# Patient Record
Sex: Male | Born: 1950 | Race: White | Hispanic: No | Marital: Married | Smoking: Never smoker
Health system: Southern US, Community
[De-identification: ages and names within clinical notes are randomized; demographics above are authoritative.]

## PROBLEM LIST (undated history)

## (undated) DIAGNOSIS — K429 Umbilical hernia without obstruction or gangrene: Secondary | ICD-10-CM

## (undated) DIAGNOSIS — IMO0002 Reserved for concepts with insufficient information to code with codable children: Secondary | ICD-10-CM

## (undated) DIAGNOSIS — F410 Panic disorder [episodic paroxysmal anxiety] without agoraphobia: Secondary | ICD-10-CM

## (undated) DIAGNOSIS — Z860100 Personal history of colon polyps, unspecified: Secondary | ICD-10-CM

## (undated) DIAGNOSIS — E7800 Pure hypercholesterolemia, unspecified: Secondary | ICD-10-CM

## (undated) DIAGNOSIS — I639 Cerebral infarction, unspecified: Secondary | ICD-10-CM

## (undated) DIAGNOSIS — N400 Enlarged prostate without lower urinary tract symptoms: Secondary | ICD-10-CM

## (undated) HISTORY — DX: Personal history of colon polyps, unspecified: Z86.0100

## (undated) HISTORY — DX: Panic disorder (episodic paroxysmal anxiety): F41.0

## (undated) HISTORY — DX: Reserved for concepts with insufficient information to code with codable children: IMO0002

## (undated) HISTORY — DX: Cerebral infarction, unspecified: I63.9

## (undated) HISTORY — DX: Personal history of colonic polyps: Z86.010

## (undated) HISTORY — DX: Pure hypercholesterolemia, unspecified: E78.00

## (undated) HISTORY — DX: Benign prostatic hyperplasia without lower urinary tract symptoms: N40.0

## (undated) HISTORY — DX: Umbilical hernia without obstruction or gangrene: K42.9

## (undated) HISTORY — PX: KNEE ARTHROSCOPY: SUR90

---

## 2001-06-08 ENCOUNTER — Encounter: Payer: Self-pay | Admitting: Pulmonary Disease

## 2001-06-08 ENCOUNTER — Ambulatory Visit (HOSPITAL_COMMUNITY): Admission: RE | Admit: 2001-06-08 | Discharge: 2001-06-08 | Payer: Self-pay | Admitting: Pulmonary Disease

## 2005-05-16 ENCOUNTER — Ambulatory Visit: Payer: Self-pay | Admitting: Pulmonary Disease

## 2006-06-12 ENCOUNTER — Ambulatory Visit: Payer: Self-pay | Admitting: Pulmonary Disease

## 2006-12-01 ENCOUNTER — Ambulatory Visit: Payer: Self-pay | Admitting: Pulmonary Disease

## 2006-12-01 LAB — CONVERTED CEMR LAB
Albumin: 4 g/dL (ref 3.5–5.2)
BUN: 13 mg/dL (ref 6–23)
Basophils Absolute: 0 10*3/uL (ref 0.0–0.1)
Basophils Relative: 0.5 % (ref 0.0–1.0)
CO2: 29 meq/L (ref 19–32)
Calcium: 9.4 mg/dL (ref 8.4–10.5)
Chloride: 109 meq/L (ref 96–112)
Creatinine, Ser: 1.2 mg/dL (ref 0.4–1.5)
Eosinophils Absolute: 0.2 10*3/uL (ref 0.0–0.6)
Eosinophils Relative: 3.9 % (ref 0.0–5.0)
GFR calc Af Amer: 81 mL/min
GFR calc non Af Amer: 67 mL/min
Glucose, Bld: 93 mg/dL (ref 70–99)
HCT: 44.3 % (ref 39.0–52.0)
Hemoglobin: 15.4 g/dL (ref 13.0–17.0)
Lymphocytes Relative: 25.3 % (ref 12.0–46.0)
MCHC: 34.8 g/dL (ref 30.0–36.0)
MCV: 90.9 fL (ref 78.0–100.0)
Monocytes Absolute: 0.3 10*3/uL (ref 0.2–0.7)
Monocytes Relative: 7.9 % (ref 3.0–11.0)
Neutro Abs: 2.8 10*3/uL (ref 1.4–7.7)
Neutrophils Relative %: 62.4 % (ref 43.0–77.0)
Phosphorus: 3.5 mg/dL (ref 2.3–4.6)
Platelets: 148 10*3/uL — ABNORMAL LOW (ref 150–400)
Potassium: 4 meq/L (ref 3.5–5.1)
RBC: 4.88 M/uL (ref 4.22–5.81)
RDW: 12 % (ref 11.5–14.6)
Sed Rate: 4 mm/hr (ref 0–20)
Sodium: 143 meq/L (ref 135–145)
WBC: 4.4 10*3/uL — ABNORMAL LOW (ref 4.5–10.5)

## 2007-06-04 ENCOUNTER — Ambulatory Visit: Payer: Self-pay | Admitting: Pulmonary Disease

## 2007-06-05 ENCOUNTER — Ambulatory Visit: Payer: Self-pay | Admitting: Cardiovascular Disease

## 2007-07-10 DIAGNOSIS — E7800 Pure hypercholesterolemia, unspecified: Secondary | ICD-10-CM

## 2007-07-10 DIAGNOSIS — D126 Benign neoplasm of colon, unspecified: Secondary | ICD-10-CM

## 2007-07-10 DIAGNOSIS — IMO0002 Reserved for concepts with insufficient information to code with codable children: Secondary | ICD-10-CM | POA: Insufficient documentation

## 2007-07-16 ENCOUNTER — Emergency Department (HOSPITAL_COMMUNITY): Admission: EM | Admit: 2007-07-16 | Discharge: 2007-07-17 | Payer: Self-pay | Admitting: Emergency Medicine

## 2007-08-10 ENCOUNTER — Ambulatory Visit: Payer: Self-pay | Admitting: Pulmonary Disease

## 2007-08-10 DIAGNOSIS — N138 Other obstructive and reflux uropathy: Secondary | ICD-10-CM

## 2007-08-10 DIAGNOSIS — N401 Enlarged prostate with lower urinary tract symptoms: Secondary | ICD-10-CM

## 2007-08-10 DIAGNOSIS — K429 Umbilical hernia without obstruction or gangrene: Secondary | ICD-10-CM | POA: Insufficient documentation

## 2007-08-10 DIAGNOSIS — F410 Panic disorder [episodic paroxysmal anxiety] without agoraphobia: Secondary | ICD-10-CM

## 2007-08-12 LAB — CONVERTED CEMR LAB
ALT: 34 units/L (ref 0–53)
AST: 24 units/L (ref 0–37)
Albumin: 4.5 g/dL (ref 3.5–5.2)
Alkaline Phosphatase: 55 units/L (ref 39–117)
BUN: 12 mg/dL (ref 6–23)
Basophils Absolute: 0 10*3/uL (ref 0.0–0.1)
Basophils Relative: 0.1 % (ref 0.0–1.0)
Bilirubin Urine: NEGATIVE
Bilirubin, Direct: 0.2 mg/dL (ref 0.0–0.3)
CO2: 29 meq/L (ref 19–32)
Calcium: 9.6 mg/dL (ref 8.4–10.5)
Chloride: 105 meq/L (ref 96–112)
Cholesterol: 158 mg/dL (ref 0–200)
Creatinine, Ser: 1.1 mg/dL (ref 0.4–1.5)
Crystals: NEGATIVE
Eosinophils Absolute: 0.1 10*3/uL (ref 0.0–0.6)
Eosinophils Relative: 2.4 % (ref 0.0–5.0)
GFR calc Af Amer: 89 mL/min
GFR calc non Af Amer: 73 mL/min
Glucose, Bld: 106 mg/dL — ABNORMAL HIGH (ref 70–99)
HCT: 46.8 % (ref 39.0–52.0)
HDL: 31.9 mg/dL — ABNORMAL LOW (ref >39.0)
Hemoglobin, Urine: NEGATIVE
Hemoglobin: 16.3 g/dL (ref 13.0–17.0)
Ketones, ur: NEGATIVE mg/dL
LDL Cholesterol: 108 mg/dL — ABNORMAL HIGH (ref 0–99)
Leukocytes, UA: NEGATIVE
Lymphocytes Relative: 31.7 % (ref 12.0–46.0)
MCHC: 34.8 g/dL (ref 30.0–36.0)
MCV: 92.6 fL (ref 78.0–100.0)
Monocytes Absolute: 0.4 10*3/uL (ref 0.2–0.7)
Monocytes Relative: 7.4 % (ref 3.0–11.0)
Neutro Abs: 3.1 10*3/uL (ref 1.4–7.7)
Neutrophils Relative %: 58.4 % (ref 43.0–77.0)
Nitrite: NEGATIVE
PSA: 0.7 ng/mL (ref 0.10–4.00)
Platelets: 183 10*3/uL (ref 150–400)
Potassium: 3.9 meq/L (ref 3.5–5.1)
RBC / HPF: NONE SEEN
RBC: 5.05 M/uL (ref 4.22–5.81)
RDW: 12.2 % (ref 11.5–14.6)
Sodium: 142 meq/L (ref 135–145)
Specific Gravity, Urine: 1.025 (ref 1.000–1.03)
TSH: 2.28 microintl units/mL (ref 0.35–5.50)
Total Bilirubin: 1.4 mg/dL — ABNORMAL HIGH (ref 0.3–1.2)
Total CHOL/HDL Ratio: 5
Total Protein: 7.3 g/dL (ref 6.0–8.3)
Triglycerides: 89 mg/dL (ref 0–149)
Urine Glucose: NEGATIVE mg/dL
Urobilinogen, UA: 1 (ref 0.0–1.0)
VLDL: 18 mg/dL (ref 0–40)
WBC: 5.3 10*3/uL (ref 4.5–10.5)
pH: 6.5 (ref 5.0–8.0)

## 2007-09-11 ENCOUNTER — Ambulatory Visit: Payer: Self-pay | Admitting: Internal Medicine

## 2008-10-09 ENCOUNTER — Ambulatory Visit: Payer: Self-pay | Admitting: Pulmonary Disease

## 2008-10-13 ENCOUNTER — Encounter: Payer: Self-pay | Admitting: Adult Health

## 2008-10-17 LAB — CONVERTED CEMR LAB
ALT: 25 units/L (ref 0–53)
AST: 18 units/L (ref 0–37)
Albumin: 4.3 g/dL (ref 3.5–5.2)
Alkaline Phosphatase: 53 units/L (ref 39–117)
BUN: 17 mg/dL (ref 6–23)
Basophils Absolute: 0 10*3/uL (ref 0.0–0.1)
Basophils Relative: 0.6 % (ref 0.0–3.0)
Bilirubin Urine: NEGATIVE
Bilirubin, Direct: 0.2 mg/dL (ref 0.0–0.3)
CO2: 29 meq/L (ref 19–32)
Calcium: 9.4 mg/dL (ref 8.4–10.5)
Chloride: 107 meq/L (ref 96–112)
Cholesterol: 125 mg/dL (ref 0–200)
Creatinine, Ser: 1.1 mg/dL (ref 0.4–1.5)
Eosinophils Absolute: 0.1 10*3/uL (ref 0.0–0.7)
Eosinophils Relative: 2.2 % (ref 0.0–5.0)
GFR calc non Af Amer: 72.97 mL/min (ref >60)
Glucose, Bld: 102 mg/dL — ABNORMAL HIGH (ref 70–99)
HCT: 46 % (ref 39.0–52.0)
HDL: 29.7 mg/dL — ABNORMAL LOW (ref >39.00)
Hemoglobin, Urine: NEGATIVE
Hemoglobin: 15.8 g/dL (ref 13.0–17.0)
Hgb A1c MFr Bld: 5.2 % (ref 4.6–6.5)
Ketones, ur: NEGATIVE mg/dL
LDL Cholesterol: 82 mg/dL (ref 0–99)
Leukocytes, UA: NEGATIVE
Lymphocytes Relative: 21.9 % (ref 12.0–46.0)
Lymphs Abs: 1.1 10*3/uL (ref 0.7–4.0)
MCHC: 34.2 g/dL (ref 30.0–36.0)
MCV: 93.5 fL (ref 78.0–100.0)
Monocytes Absolute: 0.4 10*3/uL (ref 0.1–1.0)
Monocytes Relative: 7.1 % (ref 3.0–12.0)
Neutro Abs: 3.4 10*3/uL (ref 1.4–7.7)
Neutrophils Relative %: 68.2 % (ref 43.0–77.0)
Nitrite: NEGATIVE
PSA: 0.68 ng/mL (ref 0.10–4.00)
Platelets: 152 10*3/uL (ref 150.0–400.0)
Potassium: 4.7 meq/L (ref 3.5–5.1)
RBC: 4.92 M/uL (ref 4.22–5.81)
RDW: 11.8 % (ref 11.5–14.6)
Sodium: 140 meq/L (ref 135–145)
Specific Gravity, Urine: 1.025 (ref 1.000–1.030)
TSH: 1.9 microintl units/mL (ref 0.35–5.50)
Total Bilirubin: 1.2 mg/dL (ref 0.3–1.2)
Total CHOL/HDL Ratio: 4
Total Protein, Urine: NEGATIVE mg/dL
Total Protein: 6.7 g/dL (ref 6.0–8.3)
Triglycerides: 67 mg/dL (ref 0.0–149.0)
Urine Glucose: NEGATIVE mg/dL
Urobilinogen, UA: 0.2 (ref 0.0–1.0)
VLDL: 13.4 mg/dL (ref 0.0–40.0)
WBC: 5 10*3/uL (ref 4.5–10.5)
pH: 6.5 (ref 5.0–8.0)

## 2008-12-08 ENCOUNTER — Ambulatory Visit: Payer: Self-pay | Admitting: Adult Health

## 2008-12-08 ENCOUNTER — Telehealth (INDEPENDENT_AMBULATORY_CARE_PROVIDER_SITE_OTHER): Payer: Self-pay | Admitting: *Deleted

## 2008-12-08 DIAGNOSIS — L255 Unspecified contact dermatitis due to plants, except food: Secondary | ICD-10-CM | POA: Insufficient documentation

## 2009-06-12 ENCOUNTER — Encounter (INDEPENDENT_AMBULATORY_CARE_PROVIDER_SITE_OTHER): Payer: Self-pay | Admitting: *Deleted

## 2009-07-07 ENCOUNTER — Telehealth: Payer: Self-pay | Admitting: Pulmonary Disease

## 2010-06-16 ENCOUNTER — Ambulatory Visit: Payer: Self-pay | Admitting: Pulmonary Disease

## 2010-06-16 ENCOUNTER — Encounter: Payer: Self-pay | Admitting: Pulmonary Disease

## 2010-06-21 LAB — CONVERTED CEMR LAB
ALT: 23 units/L (ref 0–53)
Alkaline Phosphatase: 45 units/L (ref 39–117)
Basophils Relative: 0.5 % (ref 0.0–3.0)
Bilirubin, Direct: 0.1 mg/dL (ref 0.0–0.3)
Chloride: 104 meq/L (ref 96–112)
Creatinine, Ser: 1.2 mg/dL (ref 0.4–1.5)
Eosinophils Absolute: 0.1 10*3/uL (ref 0.0–0.7)
Hemoglobin: 15.9 g/dL (ref 13.0–17.0)
MCHC: 35.1 g/dL (ref 30.0–36.0)
MCV: 92.8 fL (ref 78.0–100.0)
Monocytes Absolute: 0.4 10*3/uL (ref 0.1–1.0)
Neutro Abs: 3.8 10*3/uL (ref 1.4–7.7)
Neutrophils Relative %: 67.7 % (ref 43.0–77.0)
Nitrite: NEGATIVE
RBC: 4.89 M/uL (ref 4.22–5.81)
Sodium: 141 meq/L (ref 135–145)
Total Bilirubin: 1 mg/dL (ref 0.3–1.2)
Total Protein, Urine: NEGATIVE mg/dL
pH: 5.5 (ref 5.0–8.0)

## 2010-06-23 ENCOUNTER — Ambulatory Visit: Payer: Self-pay | Admitting: Pulmonary Disease

## 2010-08-15 ENCOUNTER — Encounter: Payer: Self-pay | Admitting: Pulmonary Disease

## 2010-08-24 NOTE — Assessment & Plan Note (Signed)
Summary: cpx/ mbw   Primary Care Francisco Price:  Francisco Price  CC:  34 month ROV & CPX....  History of Present Illness: 60 y/o WM here for a follow up visit & CPX... he enjoys excellent general medical health & only takes Simva40 & an Aspirin daily... he has retired from the [employer], general practice (Games developer), & his wife is an Charity fundraiser (former [employer]) who works at [employer].   ~  June 16, 2010:  he reports doing well w/o new complaints or concerns... he remains on Simva40 plus low fat diet & due for f/u FLP today;  BP is stable & denies CP, palpit, SOB, edema, etc;  GI is stable w/o nausea, vomiting, heartburn, diarrhea, constipation, blood in stool, abdominal pain, swelling, gas, etc;  colon 2004 w/ mult sm hyperplastic polyps;  no GU symptoms, etc...    Current Problems:   HYPERCHOLESTEROLEMIA (ICD-272.0) - on SIMVASTATIN 40mg /d + low chol/ low fat diet...  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 his pain resolved spontaneously and he was checked by DrGerkin... no need for surg.  COLONIC POLYPS (ICD-211.3) - last colonoscopy was 1/04 by DrPerry and showed mult small polyps 2-3 mm size... path=hyperplastic.  BENIGN PROSTATIC HYPERTROPHY, MILD, HX OF (ICD-V13.8) - S/P vasectomy by DrWrenn 2002... prev LTOS resolved & he no longer takes the FLomax.  DEGENERATIVE DISC DISEASE (ICD-722.6) - he had Patella surg in 1975... known to have mild scoliosis and DDD from eval by DrLucey...   Hx of PANIC ATTACK (ICD-300.01) - ppt by phone call (wrong number) in the middle of the night.   Preventive Screening-Counseling & Management  Alcohol-Tobacco     Smoking Status: never  Allergies: 1)  ! Niaspan (Niacin (Antihyperlipidemic))  Comments:  Nurse/Medical  Assistant: The patient's medications and allergies were reviewed with the patient and were updated in the Medication and Allergy Lists.  Past History:  Past Medical History: HYPERCHOLESTEROLEMIA (ICD-272.0) UMBILICAL HERNIA (ICD-553.1) COLONIC POLYPS (ICD-211.3) BENIGN PROSTATIC HYPERTROPHY, MILD, HX OF (ICD-V13.8) DEGENERATIVE DISC DISEASE (ICD-722.6) Hx of PANIC ATTACK (ICD-300.01)  Family History: Reviewed history from 08/10/2007 and no changes required. Father died age 61 w/ sudden death, hx of heart disease and CABG Mother died age 25 w/ ? PTE, had hx of heart disease 4 Sibs: 3 Bro- 1 Bro died age 51 w/ stroke; 1 Bro has mental problems after scarlet fever as a child; 1 Sister.  Social History: Reviewed history from 08/10/2007 and no changes required. 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 [employer]  Review of Systems  The patient denies fever, chills, sweats, anorexia, fatigue, weakness, malaise, weight loss, sleep disorder, blurring, diplopia, eye irritation, eye discharge, vision loss, eye pain, photophobia, earache, ear discharge, tinnitus, decreased hearing, nasal congestion, nosebleeds, sore throat, hoarseness, chest pain, palpitations, syncope, dyspnea on exertion, orthopnea, PND, peripheral edema, cough, dyspnea at rest, excessive sputum, hemoptysis, wheezing, pleurisy, nausea, vomiting, diarrhea, constipation, change in bowel habits, abdominal pain, melena, hematochezia, jaundice, gas/bloating, indigestion/heartburn, dysphagia, odynophagia, dysuria, hematuria, urinary frequency, urinary hesitancy, nocturia, incontinence, back pain, joint pain, joint swelling, muscle cramps,  muscle weakness, stiffness, arthritis, sciatica, restless legs, leg pain at night, leg pain with exertion, rash, itching, dryness, suspicious lesions, paralysis, paresthesias, seizures, tremors, vertigo, transient  blindness, frequent falls, frequent headaches, difficulty walking, depression, anxiety, memory loss, confusion, cold intolerance, heat intolerance, polydipsia, polyphagia, polyuria, unusual weight change, abnormal bruising, bleeding, enlarged lymph nodes, urticaria, allergic rash, hay fever, and recurrent infections.    Vital Signs:  Patient profile:   60 year old male Height:      74 inches Weight:      187.13 pounds BMI:     24.11 O2 Sat:      99 % on Room air Temp:     96.9 degrees F oral Pulse rate:   74 / minute BP sitting:   112 / 78  (right arm) Cuff size:   regular  Vitals Entered By: Randell Loop CMA (June 16, 2010 9:28 AM)  O2 Sat at Rest %:  99 O2 Flow:  Room air CC: 34 month ROV & CPX... Is Patient Diabetic? No Pain Assessment Patient in pain? no      Comments meds updated today with pt   Physical Exam  Additional Exam:  60 y/o WM in NAD... GENERAL:  Alert & oriented; pleasant & cooperative. HEENT:  [place]/AT, EOM-full, PERRLA, Fundi-benign, EACs-clear, TMs-wnl, NOSE-clear, THROAT-clear & wnl. NECK:  Supple, no JVD; normal carotid impulses w/o bruits; no thyromegaly or nodules palpated; no lymphadenopathy. CHEST:  Clear to P & A; without wheezes/ rales/ or rhonchi. HEART:  Regular Rhythm; without murmurs/ rubs/ or gallops. ABDOMEN:  Soft & nontender; normal bowel sounds; no organomegaly or masses detected. EXT: without deformities or arthritic changes; no varicose veins/ venous insuffic/ or edema. NEURO:  CN's intact; motor testing normal; sensory testing normal; gait normal & balance OK. DERM:  no lesions noted...    CXR  Procedure date:  06/16/2010  Findings:      CHEST - 2 VIEW Comparison: 08/10/2007   Findings: The cardiomediastinal silhouette is unremarkable. The lungs are clear. There is no evidence of focal airspace disease, pulmonary edema, pulmonary nodule/mass, pleural effusion, or pneumothorax. No acute bony abnormalities are  identified.   IMPRESSION: No evidence of active cardiopulmonary disease.   Read By:  Rosendo Gros,  M.D.   EKG  Procedure date:  06/16/2010  Findings:      Normal sinus rhythm with rate of:  66/ min... Tracing is WNL, NAD... SN  MISC. Report  Procedure date:  06/16/2010  Findings:      BMP (METABOL)   Sodium                    141 mEq/L                   135-145   Potassium                 4.2 mEq/L                   3.5-5.1   Chloride                  104 mEq/L                   96-112   Carbon Dioxide            30 mEq/L                    19-32   Glucose              [  H]  104 mg/dL                   29-56   BUN                       14 mg/dL                    2-13   Creatinine                1.2 mg/dL                   0.8-6.5   Calcium                   9.4 mg/dL                   7.8-46.9   GFR                       63.18 mL/min                >60  Hepatic/Liver Function Panel (HEPATIC)   Total Bilirubin           1.0 mg/dL                   6.2-9.5   Direct Bilirubin          0.1 mg/dL                   2.8-4.1   Alkaline Phosphatase      45 U/L                      39-117   AST                       18 U/L                      0-37   ALT                       23 U/L                      0-53   Total Protein             6.7 g/dL                    3.2-4.4   Albumin                   4.4 g/dL                    0.1-0.2  CBC Platelet w/Diff (CBCD)   White Cell Count          5.6 K/uL                    4.5-10.5   Red Cell Count            4.89 Mil/uL                 4.22-5.81   Hemoglobin                15.9 g/dL                   72.5-36.6   Hematocrit  45.4 %                      39.0-52.0   MCV                       92.8 fl                     78.0-100.0   Platelet Count            179.0 K/uL                  150.0-400.0   Neutrophil %              67.7 %                      43.0-77.0   Lymphocyte %              22.7 %                       12.0-46.0   Monocyte %                6.9 %                       3.0-12.0   Eosinophils%              2.2 %                       0.0-5.0   Basophils %               0.5 %                       0.0-3.0  Comments:      IBC Panel (IBC)   Iron                      134 ug/dL                   47-829   Transferrin               229.1 mg/dL                 562.1-308.6   Iron Saturation           41.8 %                      20.0-50.0  TSH (TSH)   FastTSH                   1.66 uIU/mL                 0.35-5.50  Prostate Specific Antigen (PSA)   PSA-Hyb                   0.95 ng/mL                  0.10-4.00  UDip w/Micro (URINE)   Color                     YELLOW   Clarity                   CLEAR  Clear   Specific Gravity          >=1.030                     1.000 - 1.030   Urine Ph                  5.5                         5.0-8.0   Protein                   NEGATIVE                    Negative   Urine Glucose             NEGATIVE                    Negative   Ketones                   NEGATIVE                    Negative   Urine Bilirubin           NEGATIVE                    Negative   Blood                     NEGATIVE                    Negative   Urobilinogen              0.2                         0.0 - 1.0   Leukocyte Esterace        NEGATIVE                    Negative   Nitrite                   NEGATIVE                    Negative   Urine WBC                 0-2/hpf                     0-2/hpf   Urine Epith               Rare(0-4/hpf)               Rare(0-4/hpf)   Impression & Recommendations:  Problem # 1:  PHYSICAL EXAMINATION (ICD-V70.0)  Orders: EKG w/ Interpretation (93000) T-2 View CXR (71020TC) TLB-BMP (Basic Metabolic Panel-BMET) (80048-METABOL) TLB-Hepatic/Liver Function Pnl (80076-HEPATIC) TLB-CBC Platelet - w/Differential (85025-CBCD) TLB-IBC Pnl (Iron/FE;Transferrin) (83550-IBC) TLB-TSH (Thyroid Stimulating Hormone)  (84443-TSH) TLB-PSA (Prostate Specific Antigen) (84153-PSA) TLB-Udip w/ Micro (81001-URINE)  Problem # 2:  HYPERCHOLESTEROLEMIA (ICD-272.0) He will need to ret for FLP... His updated medication list for this problem includes:    Zocor 40 Mg Tabs (Simvastatin) .Marland Kitchen... Take one tablet by mouth at bedtime  Problem # 3:  UMBILICAL HERNIA (ICD-553.1) Neg on exam today...  Problem # 4:  COLONIC POLYPS (ICD-211.3) He had hyperplastic polyps in 2004 by  DrPerry...  Problem # 5:  BENIGN PROSTATIC HYPERTROPHY, MILD, HX OF (ICD-V13.8) He denies LTOS...  Problem # 6:  DEGENERATIVE DISC DISEASE (ICD-722.6) No report of back pain etc...  Problem # 7:  Hx of PANIC ATTACK (ICD-300.01) No recurrent episodes...  Complete Medication List: 1)  Adult Aspirin Ec Low Strength 81 Mg Tbec (Aspirin) .... Take 1 tablet by mouth once a day 2)  Zocor 40 Mg Tabs (Simvastatin) .... Take one tablet by mouth at bedtime  Patient Instructions: 1)  [name], it was good seeing you again (& send our love to [place])... 2)  We refilled your Simvastatin today... 3)  Today we did your follow up CXR, EKG, & fasting blood work... please call the "phone tree" in a few days for your lab results.Marland KitchenMarland Kitchen 4)  Call for any problems.Marland KitchenMarland Kitchen 5)  Please schedule a follow-up appointment in 1 year. Prescriptions: ZOCOR 40 MG  TABS (SIMVASTATIN) take one tablet by mouth at bedtime  #90 x 4   Entered and Authorized by:   Michele Mcalpine MD   Signed by:   Michele Mcalpine MD on 06/16/2010   Method used:   Print then Give to Patient   RxID:   [id number]

## 2011-04-29 LAB — DIFFERENTIAL
Eosinophils Absolute: 0.2
Eosinophils Relative: 4
Lymphs Abs: 1.8

## 2011-04-29 LAB — CBC
HCT: 46.4
MCV: 90.5
Platelets: 176
WBC: 6.3

## 2011-04-29 LAB — I-STAT 8, (EC8 V) (CONVERTED LAB)
Acid-Base Excess: 2
Chloride: 106
TCO2: 28
pCO2, Ven: 41.4 — ABNORMAL LOW
pH, Ven: 7.415 — ABNORMAL HIGH

## 2011-04-29 LAB — POCT CARDIAC MARKERS
Myoglobin, poc: 78.8
Operator id: 257131
Troponin i, poc: <0.05

## 2011-04-29 LAB — POCT I-STAT CREATININE
Creatinine, Ser: 1.3
Operator id: 257131

## 2011-04-29 LAB — D-DIMER, QUANTITATIVE: D-Dimer, Quant: 0.27

## 2011-05-22 ENCOUNTER — Ambulatory Visit (INDEPENDENT_AMBULATORY_CARE_PROVIDER_SITE_OTHER): Payer: BC Managed Care – PPO

## 2011-05-22 ENCOUNTER — Inpatient Hospital Stay (INDEPENDENT_AMBULATORY_CARE_PROVIDER_SITE_OTHER)
Admission: RE | Admit: 2011-05-22 | Discharge: 2011-05-22 | Disposition: A | Discharge status: Home / Self Care | Payer: BC Managed Care – PPO | Source: Ambulatory Visit | Attending: Family Medicine | Admitting: Family Medicine

## 2011-05-22 DIAGNOSIS — J40 Bronchitis, not specified as acute or chronic: Secondary | ICD-10-CM

## 2011-06-06 ENCOUNTER — Telehealth: Payer: Self-pay | Admitting: Pulmonary Disease

## 2011-06-06 MED ORDER — FIRST-DUKES MOUTHWASH MT SUSP: OROMUCOSAL | Status: DC

## 2011-06-06 NOTE — Telephone Encounter (Signed)
Pt is aware of SN recs and rx has been called into pharmacy for pt. Nothing further was needed

## 2011-06-06 NOTE — Telephone Encounter (Signed)
Per SN---ok to call in MMW  #4oz   1 tsp gargle and swallow four times daily.  thanks

## 2011-06-06 NOTE — Telephone Encounter (Signed)
I spoke with Francisco Price and he c/o cough w/ occas green phlem (worse in the mornings), nasal drainage down the back of throat x 3 weeks. Francisco Price [facility] and [facility] Penni Bombard and was given a zpak. Francisco Price also states he thinks he may have some thrush. Francisco Price states he has some green slime at the back of his tongue. Francisco Price is requesting recs from Dr. [name]. Please advise, thanks  Allergies  Allergen Reactions  . Niacin     REACTION: intol w/ flushing     Carver Fila, CMA

## 2011-09-06 ENCOUNTER — Other Ambulatory Visit: Payer: Self-pay | Admitting: Pulmonary Disease

## 2011-10-14 ENCOUNTER — Ambulatory Visit (INDEPENDENT_AMBULATORY_CARE_PROVIDER_SITE_OTHER): Payer: BC Managed Care – PPO | Admitting: Pulmonary Disease

## 2011-10-14 ENCOUNTER — Encounter: Payer: Self-pay | Admitting: Pulmonary Disease

## 2011-10-14 VITALS — BP 138/84 | HR 76 | Temp 96.9°F | Ht 74.0 in | Wt 210.4 lb

## 2011-10-14 DIAGNOSIS — E7800 Pure hypercholesterolemia, unspecified: Secondary | ICD-10-CM

## 2011-10-14 DIAGNOSIS — Z87898 Personal history of other specified conditions: Secondary | ICD-10-CM

## 2011-10-14 DIAGNOSIS — IMO0002 Reserved for concepts with insufficient information to code with codable children: Secondary | ICD-10-CM

## 2011-10-14 DIAGNOSIS — D126 Benign neoplasm of colon, unspecified: Secondary | ICD-10-CM

## 2011-10-14 MED ORDER — ROSUVASTATIN CALCIUM 20 MG PO TABS: 20.0000 mg | ORAL_TABLET | Freq: Every day | ORAL | Status: DC

## 2011-10-14 NOTE — Progress Notes (Signed)
Subjective:     Patient ID: Francisco Price, male   DOB: [birth year], 61 y.o.   MRN: [id number]  HPI 61 y/o WM here for a follow up visit... he enjoys excellent general medical health & only takes Simva40 & an Aspirin daily... he has retired from the Francisco Price, general practice (Games developer), & his wife is an Charity fundraiser (former Francisco Price) who works at [facility] [employer].  ~  Price 23, 2011:  he reports doing well w/o new complaints or concerns... he remains on Simva40 plus low fat diet & due for f/u FLP today;  BP is stable & denies CP, palpit, SOB, edema, etc;  GI is stable w/o nausea, vomiting, heartburn, diarrhea, constipation, blood in stool, abdominal pain, swelling, gas, etc;  colon 2004 w/ mult sm hyperplastic polyps;  no GU symptoms, etc...  ~  October 14, 2011:  48mo ROV & Francisco Price tells me he is just here to get his med refilled, doesn't want a physical or general check up & review...    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   We discussed options for his low HDL> diet & exercise, Niacin (INTOL in past w/ flushing), consider switch to Crestor...  He would like to try the Crestor & see if his TChol & LDL remain as good w/ improvement in the HDL on the diff statin med...   PROBLEM LIST:     HYPERCHOLESTEROLEMIA (ICD-272.0) - on SIMVASTATIN 40mg /d + low chol/ low fat diet... ~  FLP 11/07 showed TChol 139, TG 73, HDL 31, LDL 93 ~  FLP 1/09 showed TChol 158, TG 89, HDL 32, LDL 108 ~  FLP 3/10 showed TChol 125, TG 67, HDL 30, LDL 82 ~  FLP 11/11 ==> wasn't done... ~  FLP 2/13 at [facility] on Simva40 showed TChol 115, TG 104, HDL 29, LDL 65... He would like to try Cres20.  UMBILICAL HERNIA (ICD-553.1) - CTAbd 11/08 showed sm umbil hernia w/ area of fat necrosis... his pain resolved spontaneously and he was checked by Francisco Price... no need for surg.  COLONIC POLYPS (ICD-211.3) -  last colonoscopy was 1/04 by Francisco Price and showed mult small polyps 2-3 mm size... path=hyperplastic.  BENIGN PROSTATIC HYPERTROPHY, MILD, HX OF (ICD-V13.8) - S/P vasectomy by Francisco Price 2002... prev LTOS resolved & he no longer takes the FLomax.  DEGENERATIVE DISC DISEASE (ICD-722.6) - he had Patella surg in 1975... known to have mild scoliosis and DDD from eval by Francisco Price...   Hx of PANIC ATTACK (ICD-300.01) - ppt by phone call (wrong number) in the middle of the night.   No past surgical history on file.   Outpatient Encounter Prescriptions as of 10/14/2011  Medication Sig Dispense Refill  . aspirin 81 MG tablet Take 81 mg by mouth daily.      Marland Kitchen omeprazole (PRILOSEC OTC) 20 MG tablet Take 20 mg by mouth daily.      . simvastatin (ZOCOR) 40 MG tablet TAKE 1 TABLET AT BEDTIME  90 tablet  0    Allergies  Allergen Reactions  . Niacin     REACTION: intol w/ flushing    Current Medications, Allergies, Past Medical History, Past Surgical History, Family History, and Social History were reviewed in [employer] record.   Review of Systems    The patient denies fever, chills, sweats, anorexia, fatigue, weakness,  malaise, weight loss, sleep disorder, blurring, diplopia, eye irritation, eye discharge, vision loss, eye pain, photophobia, earache, ear discharge, tinnitus, decreased hearing, nasal congestion, nosebleeds, sore throat, hoarseness, chest pain, palpitations, syncope, dyspnea on exertion, orthopnea, PND, peripheral edema, cough, dyspnea at rest, excessive sputum, hemoptysis, wheezing, pleurisy, nausea, vomiting, diarrhea, constipation, change in bowel habits, abdominal pain, melena, hematochezia, jaundice, gas/bloating, indigestion/heartburn, dysphagia, odynophagia, dysuria, hematuria, urinary frequency, urinary hesitancy, nocturia, incontinence, back pain, joint pain, joint swelling, muscle cramps, muscle weakness, stiffness, arthritis, sciatica, restless legs, leg  pain at night, leg pain with exertion, rash, itching, dryness, suspicious lesions, paralysis, paresthesias, seizures, tremors, vertigo, transient blindness, frequent falls, frequent headaches, difficulty walking, depression, anxiety, memory loss, confusion, cold intolerance, heat intolerance, polydipsia, polyphagia, polyuria, unusual weight change, abnormal bruising, bleeding, enlarged lymph nodes, urticaria, allergic rash, hay fever, and recurrent infections.     Objective:   Physical Exam      61 y/o WM in NAD... GENERAL:  Alert & oriented; pleasant & cooperative. HEENT:  [place]/AT, EOM-full, PERRLA, Fundi-benign, EACs-clear, TMs-wnl, NOSE-clear, THROAT-clear & wnl. NECK:  Supple, no JVD; normal carotid impulses w/o bruits; no thyromegaly or nodules palpated; no lymphadenopathy. CHEST:  Clear to P & A; without wheezes/ rales/ or rhonchi. HEART:  Regular Rhythm; without murmurs/ rubs/ or gallops. ABDOMEN:  Soft & nontender; normal bowel sounds; no organomegaly or masses detected. EXT: without deformities or arthritic changes; no varicose veins/ venous insuffic/ or edema. NEURO:  CN's intact; motor testing normal; sensory testing normal; gait normal & balance OK. DERM:  no lesions noted...  RADIOLOGY DATA:  Reviewed in the EPIC EMR & discussed w/ the patient...  LABORATORY DATA:  Reviewed in the EPIC EMR & discussed w/ the patient...    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  Hx umbilical hernia  Hx colon polyps  Hx BPH  Hx DJD  Hx panic attacks     Plan:     Patient's Medications  New  Prescriptions     This OV we stopped Simva40 & switched to CRESTOR 20mg /d as a trial...  Previous Medications   ASPIRIN 81 MG TABLET    Take 81 mg by mouth daily.   OMEPRAZOLE (PRILOSEC OTC) 20 MG TABLET    Take 20 mg by mouth daily.  Modified Medications   Modified Medication Previous Medication   ROSUVASTATIN (CRESTOR) 20 MG TABLET rosuvastatin (CRESTOR) 20 MG tablet      Take 1 tablet (20 mg total) by mouth daily.    Take 20 mg by mouth daily.  Discontinued Medications   DIPHENHYD-HYDROCORT-NYSTATIN (FIRST-DUKES MOUTHWASH) SUSP    1 tsp gargle and swallow 4 times a day   SIMVASTATIN (ZOCOR) 40 MG TABLET    TAKE 1 TABLET AT BEDTIME

## 2011-10-14 NOTE — Patient Instructions (Signed)
Today we updated your med list in our EPIC system...    We decided to try to switch your Statin therapy to CRESTOR 20mg  - take one tab daily...    We want to see if this will do just as well as the Simvastatin on your LDL but also improve your HDL somewhat...  Let's plan a physical & follow up FASTING blood work in about 6 months or so.Marland KitchenMarland Kitchen

## 2011-10-17 ENCOUNTER — Telehealth: Payer: Self-pay | Admitting: Pulmonary Disease

## 2011-10-17 NOTE — Telephone Encounter (Signed)
Per SN----ok to drop by any day 1:30-2:00 for SN to check.  thanks

## 2011-10-17 NOTE — Telephone Encounter (Signed)
Called and spoke with pt and informed him of SN's response.  Pt states he will stop by tomorrow to get this done between 1:30 and 2 pm

## 2011-10-17 NOTE — Telephone Encounter (Signed)
I spoke with pt and he states that SN forgot to check his left breast bc it has been very sore x3 months. Its not enlarged or anything. Pt states SN was suppose to check this when he was here on Friday but had forgot. Pt is just concerned and wants to make sure it's "not cancer or anything". Please advise Dr. [name], thanks

## 2011-12-14 ENCOUNTER — Other Ambulatory Visit: Payer: Self-pay | Admitting: Pulmonary Disease

## 2012-04-17 ENCOUNTER — Encounter: Payer: Self-pay | Admitting: Adult Health

## 2012-04-17 ENCOUNTER — Ambulatory Visit (INDEPENDENT_AMBULATORY_CARE_PROVIDER_SITE_OTHER): Payer: BC Managed Care – PPO | Admitting: Adult Health

## 2012-04-17 VITALS — BP 118/78 | HR 71 | Temp 97.1°F | Ht 74.0 in | Wt 200.2 lb

## 2012-04-17 DIAGNOSIS — E7800 Pure hypercholesterolemia, unspecified: Secondary | ICD-10-CM

## 2012-04-17 DIAGNOSIS — M719 Bursopathy, unspecified: Secondary | ICD-10-CM

## 2012-04-17 DIAGNOSIS — M67919 Unspecified disorder of synovium and tendon, unspecified shoulder: Secondary | ICD-10-CM

## 2012-04-17 DIAGNOSIS — M7580 Other shoulder lesions, unspecified shoulder: Secondary | ICD-10-CM

## 2012-04-17 NOTE — Assessment & Plan Note (Signed)
Cont on current regimen Diet and exercise  Return for fasting labs

## 2012-04-17 NOTE — Patient Instructions (Addendum)
Advil 200mg  3 tabs Twice daily  W/ food for 5-7 days  Alternate ice /heat to left upper arm.  Please contact office for sooner follow up if symptoms do not improve or worsen or seek emergency care  Return for fasting labs next week.  follow up Dr. [name]  In 6 months for physical

## 2012-04-17 NOTE — Assessment & Plan Note (Signed)
Mild strain   Plan Advil 200mg  3 tabs Twice daily  W/ food for 5-7 days  Alternate ice /heat to left upper arm.  Please contact office for sooner follow up if symptoms do not improve or worsen or seek emergency care  Follow up Dr. [name]  In 6 months for physical

## 2012-04-17 NOTE — Progress Notes (Signed)
Subjective:     Patient ID: Francisco Price, male   DOB: [birth year], 61 y.o.   MRN: [id number]  HPI  61 y/o WM  .Marland Kitchen he enjoys excellent general medical health & only takes Simva40 & an Aspirin daily... 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  ~  Price 23, 2011:  he reports doing well w/o new complaints or concerns... he remains on Simva40 plus low fat diet & due for f/u FLP today;  BP is stable & denies CP, palpit, SOB, edema, etc;  GI is stable w/o nausea, vomiting, heartburn, diarrhea, constipation, blood in stool, abdominal pain, swelling, gas, etc;  colon 2004 w/ mult sm hyperplastic polyps;  no GU symptoms, etc...  ~  October 14, 2011:  5mo ROV & Francisco Price tells me he is just here to get his med refilled, doesn't want a physical or general check up & review...    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   We discussed options for his low HDL> diet & exercise, Niacin (INTOL in past w/ flushing), consider switch to Crestor...  He would like to try the Crestor & see if his TChol & LDL remain as good w/ improvement in the HDL on the diff statin med...  04/17/2012 Acute OV  Complains sore muscle on upper left arm x3 weeks w/ tenderness toward the shoulder. Sore to lift arm over his head, tender to touch along upper left arm.  No known injury. No redness or ecchymosis.  No fever. No paresthesias or weakness .   He did not return for fasting lipid panel as prev recommended.  Tolerating crestor .  No myalgias.    PROBLEM LIST:     HYPERCHOLESTEROLEMIA (ICD-272.0) - on SIMVASTATIN 40mg /d + low chol/ low fat diet... ~  FLP 11/07 showed TChol 139, TG 73, HDL 31, LDL 93 ~  FLP 1/09 showed TChol 158, TG 89, HDL 32, LDL 108 ~  FLP 3/10 showed TChol 125, TG 67, HDL 30, LDL 82 ~   FLP 11/11 ==> wasn't done... ~  FLP 2/13 at [facility] on Simva40 showed TChol 115, TG 104, HDL 29, LDL 65... He would like to try Cres20.  UMBILICAL HERNIA (ICD-553.1) - CTAbd 11/08 showed sm umbil hernia w/ area of fat necrosis... his pain resolved spontaneously and he was checked by Francisco Price... no need for surg.  COLONIC POLYPS (ICD-211.3) - last colonoscopy was 1/04 by Francisco Price and showed mult small polyps 2-3 mm size... path=hyperplastic.  BENIGN PROSTATIC HYPERTROPHY, MILD, HX OF (ICD-V13.8) - S/P vasectomy by Francisco Price 2002... prev LTOS resolved & he no longer takes the FLomax.  DEGENERATIVE DISC DISEASE (ICD-722.6) - he had Patella surg in 1975... known to have mild scoliosis and DDD from eval by Francisco Price...   Hx of PANIC ATTACK (ICD-300.01) - ppt by phone call (wrong number) in the middle of the night.   No past surgical history on file.     Allergies  Allergen Reactions  . Niacin     REACTION: intol w/ flushing    Current Medications, Allergies, Past Medical History, Past Surgical History, Family History, and Social History were reviewed in [employer] record.   Review of Systems Constitutional:   No  weight loss, night  sweats,  Fevers, chills, fatigue, or  lassitude.  HEENT:   No headaches,  Difficulty swallowing,  Tooth/dental problems, or  Sore throat,                No sneezing, itching, ear ache, nasal congestion, post nasal drip,   CV:  No chest pain,  Orthopnea, PND, swelling in lower extremities, anasarca, dizziness, palpitations, syncope.   GI  No heartburn, indigestion, abdominal pain, nausea, vomiting, diarrhea, change in bowel habits, loss of appetite, bloody stools.   Resp: No shortness of breath with exertion or at rest.  No excess mucus, no productive cough,  No non-productive cough,  No coughing up of blood.  No change in color of mucus.  No wheezing.  No chest wall deformity  Skin: no rash or lesions.  GU: no dysuria, change in color  of urine, no urgency or frequency.  No flank pain, no hematuria   MS:  +joint pain    No back pain.  Psych:  No change in mood or affect. No depression or anxiety.  No memory loss.          Objective:   Physical Exam       61 y/o WM in NAD... GENERAL:  Alert & oriented; pleasant & cooperative. HEENT:  [place]/AT, EOM-full,   EACs-clear, TMs-wnl, NOSE-clear, THROAT-clear & wnl. NECK:  Supple, no JVD; normal carotid impulses w/o bruits; no thyromegaly or nodules palpated; no lymphadenopathy. CHEST:  Clear to P & A; without wheezes/ rales/ or rhonchi. HEART:  Regular Rhythm; without murmurs/ rubs/ or gallops. ABDOMEN:  Soft & nontender; normal bowel sounds; no organomegaly or masses detected. EXT: without deformities or arthritic changes; no varicose veins/ venous insuffic/ or edema. Tender along Left upper lateral arm, no deformity noted, nml grips, abduction/adduction ROM nml  nml strength.  NEURO:   motor testing normal; sensory testing normal; gait normal & balance OK. DERM:  no lesions noted...    Assessment:

## 2012-10-16 ENCOUNTER — Ambulatory Visit: Payer: BC Managed Care – PPO | Admitting: Pulmonary Disease

## 2012-11-13 ENCOUNTER — Ambulatory Visit: Payer: BC Managed Care – PPO | Admitting: Pulmonary Disease

## 2012-11-20 ENCOUNTER — Ambulatory Visit (INDEPENDENT_AMBULATORY_CARE_PROVIDER_SITE_OTHER)
Admission: RE | Admit: 2012-11-20 | Discharge: 2012-11-20 | Disposition: A | Discharge status: Home / Self Care | Payer: BC Managed Care – PPO | Source: Ambulatory Visit | Attending: Pulmonary Disease | Admitting: Pulmonary Disease

## 2012-11-20 ENCOUNTER — Encounter: Payer: Self-pay | Admitting: Pulmonary Disease

## 2012-11-20 ENCOUNTER — Other Ambulatory Visit (INDEPENDENT_AMBULATORY_CARE_PROVIDER_SITE_OTHER): Payer: BC Managed Care – PPO

## 2012-11-20 ENCOUNTER — Ambulatory Visit (INDEPENDENT_AMBULATORY_CARE_PROVIDER_SITE_OTHER): Payer: BC Managed Care – PPO | Admitting: Pulmonary Disease

## 2012-11-20 ENCOUNTER — Encounter: Payer: Self-pay | Admitting: Internal Medicine

## 2012-11-20 VITALS — BP 118/82 | HR 70 | Temp 97.5°F | Ht 74.0 in | Wt 202.2 lb

## 2012-11-20 DIAGNOSIS — Z0000 Encounter for general adult medical examination without abnormal findings: Secondary | ICD-10-CM

## 2012-11-20 DIAGNOSIS — K429 Umbilical hernia without obstruction or gangrene: Secondary | ICD-10-CM

## 2012-11-20 DIAGNOSIS — D126 Benign neoplasm of colon, unspecified: Secondary | ICD-10-CM

## 2012-11-20 DIAGNOSIS — IMO0002 Reserved for concepts with insufficient information to code with codable children: Secondary | ICD-10-CM

## 2012-11-20 DIAGNOSIS — Z87898 Personal history of other specified conditions: Secondary | ICD-10-CM

## 2012-11-20 DIAGNOSIS — F410 Panic disorder [episodic paroxysmal anxiety] without agoraphobia: Secondary | ICD-10-CM

## 2012-11-20 DIAGNOSIS — E7800 Pure hypercholesterolemia, unspecified: Secondary | ICD-10-CM

## 2012-11-20 LAB — HEPATIC FUNCTION PANEL
AST: 23 U/L (ref 0–37)
Albumin: 4.5 g/dL (ref 3.5–5.2)
Alkaline Phosphatase: 76 U/L (ref 39–117)
Bilirubin, Direct: 0.2 mg/dL (ref 0.0–0.3)
Total Protein: 7 g/dL (ref 6.0–8.3)

## 2012-11-20 LAB — CBC WITH DIFFERENTIAL/PLATELET
Basophils Absolute: 0 10*3/uL (ref 0.0–0.1)
Eosinophils Absolute: 0.1 10*3/uL (ref 0.0–0.7)
Hemoglobin: 17 g/dL (ref 13.0–17.0)
Lymphocytes Relative: 24.9 % (ref 12.0–46.0)
Lymphs Abs: 1.5 10*3/uL (ref 0.7–4.0)
MCHC: 35.3 g/dL (ref 30.0–36.0)
Monocytes Relative: 7.2 % (ref 3.0–12.0)
Neutro Abs: 4 10*3/uL (ref 1.4–7.7)
Platelets: 181 10*3/uL (ref 150.0–400.0)
RDW: 12.8 % (ref 11.5–14.6)

## 2012-11-20 LAB — BASIC METABOLIC PANEL
CO2: 28 mEq/L (ref 19–32)
Calcium: 9.6 mg/dL (ref 8.4–10.5)
Chloride: 105 mEq/L (ref 96–112)
Glucose, Bld: 90 mg/dL (ref 70–99)
Potassium: 4.6 mEq/L (ref 3.5–5.1)
Sodium: 140 mEq/L (ref 135–145)

## 2012-11-20 LAB — TSH: TSH: 1.97 u[IU]/mL (ref 0.35–5.50)

## 2012-11-20 NOTE — Progress Notes (Signed)
Subjective:     Patient ID: Francisco Price, male   DOB: 07/19/1951, 62 y.o.   MRN: [id number]  HPI 62 y/o WM here for a follow up visit... he enjoys excellent general medical health & only takes Simva40 & an Aspirin daily... 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  ~  June 16, 2010:  he reports doing well w/o new complaints or concerns... he remains on Simva40 plus low fat diet & due for f/u FLP today;  BP is stable & denies CP, palpit, SOB, edema, etc;  GI is stable w/o nausea, vomiting, heartburn, diarrhea, constipation, blood in stool, abdominal pain, swelling, gas, etc;  colon 2004 w/ mult sm hyperplastic polyps;  no GU symptoms, etc...  ~  October 14, 2011:  9mo ROV & [name] tells me he is just here to get his med refilled, doesn't want a physical or general check up & review...    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   We discussed options for his low HDL> diet & exercise, Niacin (INTOL in past w/ flushing), consider switch to Crestor...  He would like to try the Crestor & see if his TChol & LDL remain as good w/ improvement in the HDL on the diff statin med...  ~  April 29,2014:  Yearly ROV & CPX>  [name] reports a good year, no new complaints or concerns other than some recent dental work... We reviewed the following medical problems during today's office visit >>     Chol> on Simva40; FLP from [facility] 2/14 showed TChol 126, TG 104, HDL 32, LDL 73    Colon polyps> hx of hyperplastic polyps removed by DrPerry 1/04 & due for f/u colon now- refer to GI...    BPH> s/p vasectomy by DrWrenn in 2002, hx of mild LTOS- resolved & not on meds...    LBP, DDD> previously eval by DrLucey w/ mild scoliosis & DDD- currently doing well w/o LBP...    Hx of panic attack> only one  episode that occurred several years ago, no recurrence.     Derm>> pt mentioned bizarre hx of 3-4 slits in skin of left forehead that seems to occur spontaneously, lasts for a week then resolves on its own; denies trauma, occurs 2-3 times per year and patient does not have a clue (rec to take pic or come to office)... We reviewed prob list, meds, xrays and labs> see below for updates >>  CXR 4/14 showed normal heart size, clear lungs, WNL.Marland KitchenMarland Kitchen EKG 4/14 showed NSR, rate61, WNL LABS 4/14:  Chems- wnl x Cr=1.3;  CBC- wnl w/ Hg=17.0;  TSH=1.97;  PSA=1.06...          PROBLEM LIST:     HYPERCHOLESTEROLEMIA (ICD-272.0) - on SIMVASTATIN 40mg /d + low chol/ low fat diet... ~  FLP 11/07 showed TChol 139, TG 73, HDL 31, LDL 93 ~  FLP 1/09 showed TChol 158, TG 89, HDL 32, LDL 108 ~  FLP 3/10 showed TChol 125, TG 67, HDL 30, LDL 82 ~  FLP 11/11 ==> wasn't done... ~  FLP 2/13 at [facility] on Simva40 showed TChol 115, TG 104, HDL 29, LDL 65... He would like to try Cres20. ~  FLP 2/14 at [facility] on Simva40 showed  TChol 126, TG 104, HDL 32, LDL 73  UMBILICAL HERNIA (ZOX-096.0) - CTAbd 11/08 showed sm umbil hernia w/ area of fat necrosis... his pain resolved spontaneously and he was checked by DrGerkin... no need for surg.  COLONIC POLYPS (ICD-211.3) - last colonoscopy was 1/04 by DrPerry and showed mult small polyps 2-3 mm size... Path=hyperplastic. ~  4/14:  Referred to GI for f/u colonoscopy...  BENIGN PROSTATIC HYPERTROPHY, MILD, HX OF (ICD-V13.8) - S/P vasectomy by DrWrenn 2002... prev LTOS resolved & he no longer takes the FLomax.  DEGENERATIVE DISC DISEASE (ICD-722.6) - he had Patella surg in 1975... known to have mild scoliosis and DDD from eval by DrLucey...   Hx of PANIC ATTACK (ICD-300.01) - ppt by phone call (wrong number) in the middle of the night.   History reviewed. No pertinent past surgical history.   Outpatient Encounter Prescriptions as of 11/20/2012  Medication Sig Dispense Refill  .  aspirin 81 MG tablet Take 81 mg by mouth daily.      Marland Kitchen omeprazole (PRILOSEC OTC) 20 MG tablet Take 20 mg by mouth daily.      . simvastatin (ZOCOR) 40 MG tablet Take 40 mg by mouth every evening.      . [DISCONTINUED] rosuvastatin (CRESTOR) 20 MG tablet Take 1 tablet (20 mg total) by mouth daily.  90 tablet  3   No facility-administered encounter medications on file as of 11/20/2012.    Allergies  Allergen Reactions  . Niacin     REACTION: intol w/ flushing    Current Medications, Allergies, Past Medical History, Past Surgical History, Family History, and Social History were reviewed in [employer] record.   Review of Systems    The patient denies fever, chills, sweats, anorexia, fatigue, weakness, malaise, weight loss, sleep disorder, blurring, diplopia, eye irritation, eye discharge, vision loss, eye pain, photophobia, earache, ear discharge, tinnitus, decreased hearing, nasal congestion, nosebleeds, sore throat, hoarseness, chest pain, palpitations, syncope, dyspnea on exertion, orthopnea, PND, peripheral edema, cough, dyspnea at rest, excessive sputum, hemoptysis, wheezing, pleurisy, nausea, vomiting, diarrhea, constipation, change in bowel habits, abdominal pain, melena, hematochezia, jaundice, gas/bloating, indigestion/heartburn, dysphagia, odynophagia, dysuria, hematuria, urinary frequency, urinary hesitancy, nocturia, incontinence, back pain, joint pain, joint swelling, muscle cramps, muscle weakness, stiffness, arthritis, sciatica, restless legs, leg pain at night, leg pain with exertion, rash, itching, dryness, suspicious lesions, paralysis, paresthesias, seizures, tremors, vertigo, transient blindness, frequent falls, frequent headaches, difficulty walking, depression, anxiety, memory loss, confusion, cold intolerance, heat intolerance, polydipsia, polyphagia, polyuria, unusual weight change, abnormal bruising, bleeding, enlarged lymph nodes, urticaria, allergic  rash, hay fever, and recurrent infections.     Objective:   Physical Exam      62 y/o WM in NAD... GENERAL:  Alert & oriented; pleasant & cooperative. HEENT:  [place]/AT, EOM-full, PERRLA, Fundi-benign, EACs-clear, TMs-wnl, NOSE-clear, THROAT-clear & wnl. NECK:  Supple, no JVD; normal carotid impulses w/o bruits; no thyromegaly or nodules palpated; no lymphadenopathy. CHEST:  Clear to P & A; without wheezes/ rales/ or rhonchi. HEART:  Regular Rhythm; without murmurs/ rubs/ or gallops. ABDOMEN:  Soft & nontender; normal bowel sounds; no organomegaly or masses detected. EXT: without deformities or arthritic changes; no varicose veins/ venous insuffic/ or edema. NEURO:  CN's intact; motor testing normal; sensory testing normal; gait normal & balance OK. DERM:  no lesions noted...  RADIOLOGY DATA:  Reviewed in the EPIC EMR & discussed w/ the patient...  LABORATORY DATA:  Reviewed in the EPIC EMR & discussed w/ the patient.Marland KitchenMarland Kitchen  Assessment:      Hypercholesterolemia> stable  On simva 40.   Hx umbilical hernia> small umbilical hernia, asymptomatic.   Hx colon polyps> last colon 2004 by Dr. Marina Goodell, due for f/u and we will make referral.   Hx BPH> S/P vasectomy 2002 by Dr. Annabell Howells.  Denied LTOS and doing well without meds.   Hx DJD> hx of mild DDD and previously eval by Dr. Sherlean Foot and he denies current symptoms.   Hx panic attacks> 1 episode years ago and no occurrence.      Plan:     Patient's Medications  New Prescriptions   No medications on file  Previous Medications   ASPIRIN 81 MG TABLET    Take 81 mg by mouth daily.   OMEPRAZOLE (PRILOSEC OTC) 20 MG TABLET    Take 20 mg by mouth daily.   SIMVASTATIN (ZOCOR) 40 MG TABLET    Take 40 mg by mouth every evening.  Modified Medications   No medications on file  Discontinued Medications   ROSUVASTATIN (CRESTOR) 20 MG TABLET    Take 1 tablet (20 mg total) by mouth daily.

## 2012-11-20 NOTE — Patient Instructions (Addendum)
Today we updated your med list in our EPIC system...    Continue your current medications the same...    We refilled your meds per request...  Today we did your follow up CXR, EKG, & non-fasting blood work...    We will contact you w/ the results when available...   We will refer your chart to DrPerry in GI for your follow up colonoscopy due now...  Call for any questions...  Let's plan a follow up visit in 47yr, sooner if needed for problems.Marland KitchenMarland Kitchen

## 2012-12-04 ENCOUNTER — Encounter: Payer: Self-pay | Admitting: Physician Assistant

## 2012-12-04 ENCOUNTER — Ambulatory Visit (INDEPENDENT_AMBULATORY_CARE_PROVIDER_SITE_OTHER)
Admission: RE | Admit: 2012-12-04 | Discharge: 2012-12-04 | Disposition: A | Discharge status: Home / Self Care | Payer: BC Managed Care – PPO | Source: Ambulatory Visit | Attending: Physician Assistant | Admitting: Physician Assistant

## 2012-12-04 ENCOUNTER — Telehealth: Payer: Self-pay | Admitting: Internal Medicine

## 2012-12-04 ENCOUNTER — Ambulatory Visit (INDEPENDENT_AMBULATORY_CARE_PROVIDER_SITE_OTHER): Payer: BC Managed Care – PPO | Admitting: Physician Assistant

## 2012-12-04 ENCOUNTER — Other Ambulatory Visit: Payer: Self-pay | Admitting: Pulmonary Disease

## 2012-12-04 ENCOUNTER — Ambulatory Visit: Payer: BC Managed Care – PPO | Admitting: Pulmonary Disease

## 2012-12-04 VITALS — BP 106/74 | HR 72 | Ht 74.0 in | Wt 195.2 lb

## 2012-12-04 DIAGNOSIS — R140 Abdominal distension (gaseous): Secondary | ICD-10-CM

## 2012-12-04 DIAGNOSIS — R109 Unspecified abdominal pain: Secondary | ICD-10-CM

## 2012-12-04 DIAGNOSIS — K5900 Constipation, unspecified: Secondary | ICD-10-CM

## 2012-12-04 DIAGNOSIS — R141 Gas pain: Secondary | ICD-10-CM

## 2012-12-04 DIAGNOSIS — D126 Benign neoplasm of colon, unspecified: Secondary | ICD-10-CM

## 2012-12-04 MED ORDER — IOHEXOL 300 MG/ML  SOLN
100.0000 mL | Freq: Once | INTRAMUSCULAR | Status: AC | PRN
  Administered 2012-12-04: 100 mL via INTRAVENOUS

## 2012-12-04 NOTE — Telephone Encounter (Signed)
Pt was scheduled for pre-visit and colon in June with Dr. Marina Goodell but appt was cancelled. Per appt note pt did not want to have procedure.

## 2012-12-04 NOTE — Telephone Encounter (Signed)
Dr. [name] requests pt be seen today for constipation. Pt scheduled to see Mike Gip PA today at 11am. Pt aware of appt date and time.

## 2012-12-04 NOTE — Progress Notes (Signed)
Subjective:    Patient ID: Francisco Price, male    DOB: March 12, 1951, 62 y.o.   MRN: [id number]  HPI  Francisco Price is a very nice 62 year old white male generally in good health, he does have degenerative disc disease, mild hypercholesterolemia, and has a known umbilical hernia which has  been asymptomatic. He had remote colonoscopy with Dr. Terrial Rhodes  in 2004 and had multiple diminutive polyps removed all of which were less than 4 mm. I do not have path available. He comes in today as an urgent add-on visit with complaints of abdominal bloating ,distention and discomfort. He says that he never has any GI symptoms and generally has no problems with constipation. He says he had onset this past Sunday with a full, bloated, and uncomfortable feeling in his abdomen which she says feels "like a sac of potatoes". He also has had a significant drop in his appetite just over the past few days- vague nausea, but no vomiting. He thought a laxative might  help so he took a laxative on Sunday evening and and and then again last night and has been passing small amounts of liquid stool but no solid stool. He has not noticed any melena or hematochezia. He denies any severe abdominal pain but just has had persistent discomfort. No prior abdominal surgery.    Review of Systems  Constitutional: Positive for appetite change.  HENT: Negative.   Eyes: Negative.   Respiratory: Negative.   Cardiovascular: Negative.   Gastrointestinal: Positive for abdominal pain and abdominal distention.  Endocrine: Negative.   Allergic/Immunologic: Negative.   Neurological: Negative.   Hematological: Negative.   Psychiatric/Behavioral: Negative.    Outpatient Prescriptions Prior to Visit  Medication Sig Dispense Refill  . aspirin 81 MG tablet Take 81 mg by mouth daily.      Marland Kitchen omeprazole (PRILOSEC OTC) 20 MG tablet Take 20 mg by mouth daily.      . simvastatin (ZOCOR) 40 MG tablet Take 40 mg by mouth every evening.       No  facility-administered medications prior to visit.       Allergies  Allergen Reactions  . Niacin     REACTION: intol w/ flushing   Patient Active Problem List   Diagnosis Date Noted  . Rotator cuff tendonitis 04/17/2012  . CONTACT DERMATITIS DUE TO POISON IVY 12/08/2008  . PANIC ATTACK 08/10/2007  . UMBILICAL HERNIA 08/10/2007  . BENIGN PROSTATIC HYPERTROPHY, MILD, HX OF 08/10/2007  . COLONIC POLYPS 07/10/2007  . HYPERCHOLESTEROLEMIA 07/10/2007  . DEGENERATIVE DISC DISEASE 07/10/2007   History  Substance Use Topics  . Smoking status: Never Smoker   . Smokeless tobacco: Never Used  . Alcohol Use: Yes     Comment: social use   family history includes Heart disease in his brother, father, and mother.  Objective:   Physical Exam  well-developed white male in no acute distress, pleasant accompanied by his wife a blood pressure 106/74 pulse 72 height 6 foot 2 weight 195. HEENT; nontraumatic normocephalic EOMI PERRLA sclera anicteric,Neck; Supple no JVD, Cardiovascular; regular rate and rhythm with S1-S2 no murmur or gallop, Pulmonary ;clear bilaterally, Abdomen; mildly distended with mild rather generalized tenderness there is no guarding or rebound no palpable mass or hepatosplenomegaly, he does have an umbilical hernia which is reducible bowel sounds are hyperactive, Rectal; exam not done, Extremities; no clubbing cyanosis or edema skin warm and dry, Psych mood and affect normal and appropriate        Assessment & Plan:  #  50 62 year old white male with acute onset of abdominal bloating distention and pain over the past 48 hours with inability to have a bowel movement. Symptoms have been associated with anorexia but no vomiting. Etiology of his symptoms is unclear though history and exam are worrisome for partial bowel obstruction #2 asymptomatic umbilical hernia #3 remote history of diminutive colon polyps on colonoscopy 2004   Plan; patient had labs done 11/20/2012 as part of his  physical and all were unremarkable We'll schedule for CT scan of the abdomen and pelvis today-this will be call report with further plans pending results of CT.

## 2012-12-04 NOTE — Patient Instructions (Addendum)
You have been scheduled for a CT scan of the abdomen and pelvis at [facility] ([address]---this is in the same building as Architectural technologist).   You are scheduled on 12/04/2012 at 4pm. You should arrive 15 minutes prior to your appointment time for registration. Please follow the written instructions below on the day of your exam:  WARNING: IF YOU ARE ALLERGIC TO IODINE/X-RAY DYE, PLEASE NOTIFY RADIOLOGY IMMEDIATELY AT [phone]! YOU WILL BE GIVEN A 13 HOUR PREMEDICATION PREP.  1) Do not eat or drink anything after 12pm (4 hours prior to your test) 2) You have been given 2 bottles of oral contrast to drink. The solution may taste               better if refrigerated, but do NOT add ice or any other liquid to this solution. Shake             well before drinking.    Drink 1 bottle of contrast @ 2pm (2 hours prior to your exam)  Drink 1 bottle of contrast @ 3pm (1 hour prior to your exam)  You may take any medications as prescribed with a small amount of water except for the following: Metformin, Glucophage, Glucovance, Avandamet, Riomet, Fortamet, Actoplus Met, Janumet, Glumetza or Metaglip. The above medications must be held the day of the exam AND 48 hours after the exam.  The purpose of you drinking the oral contrast is to aid in the visualization of your intestinal tract. The contrast solution may cause some diarrhea. Before your exam is started, you will be given a small amount of fluid to drink. Depending on your individual set of symptoms, you may also receive an intravenous injection of x-ray contrast/dye. Plan on being at [facility] for 30 minutes or long, depending on the type of exam you are having performed.  This test typically takes 30-45 minutes to complete.  If you have any questions regarding your exam or if you need to reschedule, you may call the CT department at [phone] between the hours of 8:00 am and 5:00 pm,  Monday-Friday.  ________________________________________________________________________

## 2012-12-04 NOTE — Progress Notes (Signed)
i agree with the plan outlined in this note 

## 2012-12-05 ENCOUNTER — Other Ambulatory Visit: Payer: Self-pay | Admitting: *Deleted

## 2012-12-24 ENCOUNTER — Other Ambulatory Visit: Payer: Self-pay | Admitting: Pulmonary Disease

## 2013-01-08 ENCOUNTER — Encounter: Payer: BC Managed Care – PPO | Admitting: Internal Medicine

## 2013-06-03 ENCOUNTER — Telehealth: Payer: Self-pay | Admitting: Pulmonary Disease

## 2013-06-03 NOTE — Telephone Encounter (Signed)
Leigh please advise where we can work the pt in. Thanks.

## 2013-06-03 NOTE — Telephone Encounter (Signed)
Ok to schedule appt for pt on 11/21 at 12.  thanks

## 2013-06-03 NOTE — Telephone Encounter (Signed)
Per SN--  What is the ASAP appt for?

## 2013-06-03 NOTE — Telephone Encounter (Signed)
I spoke with pt spouse. appt scheduled

## 2013-06-03 NOTE — Telephone Encounter (Signed)
I spoke with the pt spouse and she states the pt has been having a lot of fatigue and trouble sleeping and he wants to be seen about this. Carron Curie, CMA

## 2013-06-14 ENCOUNTER — Ambulatory Visit: Payer: BC Managed Care – PPO | Admitting: Pulmonary Disease

## 2013-06-28 ENCOUNTER — Ambulatory Visit (INDEPENDENT_AMBULATORY_CARE_PROVIDER_SITE_OTHER): Payer: BC Managed Care – PPO | Admitting: Pulmonary Disease

## 2013-06-28 ENCOUNTER — Encounter: Payer: Self-pay | Admitting: Pulmonary Disease

## 2013-06-28 ENCOUNTER — Other Ambulatory Visit (INDEPENDENT_AMBULATORY_CARE_PROVIDER_SITE_OTHER): Payer: BC Managed Care – PPO

## 2013-06-28 ENCOUNTER — Encounter (HOSPITAL_BASED_OUTPATIENT_CLINIC_OR_DEPARTMENT_OTHER): Payer: BC Managed Care – PPO

## 2013-06-28 VITALS — BP 118/76 | HR 67 | Temp 97.5°F | Ht 74.0 in | Wt 203.0 lb

## 2013-06-28 DIAGNOSIS — J069 Acute upper respiratory infection, unspecified: Secondary | ICD-10-CM

## 2013-06-28 DIAGNOSIS — E7800 Pure hypercholesterolemia, unspecified: Secondary | ICD-10-CM

## 2013-06-28 DIAGNOSIS — G479 Sleep disorder, unspecified: Secondary | ICD-10-CM

## 2013-06-28 DIAGNOSIS — IMO0002 Reserved for concepts with insufficient information to code with codable children: Secondary | ICD-10-CM

## 2013-06-28 DIAGNOSIS — Z87898 Personal history of other specified conditions: Secondary | ICD-10-CM

## 2013-06-28 DIAGNOSIS — D126 Benign neoplasm of colon, unspecified: Secondary | ICD-10-CM

## 2013-06-28 LAB — TESTOSTERONE: Testosterone: 388.8 ng/dL (ref 350.00–890.00)

## 2013-06-28 MED ORDER — METHYLPREDNISOLONE ACETATE 80 MG/ML IJ SUSP
80.0000 mg | Freq: Once | INTRAMUSCULAR | Status: AC
  Administered 2013-06-28: 80 mg via INTRAMUSCULAR

## 2013-06-28 MED ORDER — PREDNISONE (PAK) 5 MG PO TABS: ORAL_TABLET | ORAL | Status: DC

## 2013-06-28 MED ORDER — FIRST-DUKES MOUTHWASH MT SUSP: OROMUCOSAL | Status: DC

## 2013-06-28 MED ORDER — LEVOFLOXACIN 500 MG PO TABS: 500.0000 mg | ORAL_TABLET | Freq: Every day | ORAL | Status: DC

## 2013-06-28 NOTE — Patient Instructions (Signed)
Today we updated your med list in our EPIC system...    Continue your current medications the same...  For your Upper Resp infection>>    We gave you a Depo shot 7 a Pred dosepak for the airway inflammation- take as directed on pack...    We wrote for an antibiotic- LEVAQUIN- 500mg  one tab daily til gone...    While you are taking any antibiotic you should be on a Probiotic like ALIGN daily...    Try the MAGIC MOUTHWASH 1 tsp gargle 7 swallow up to 4 times daily as needed for the sore throat...  We will arrange for a Sleep study for further eval of thiose symptoms...    We will call w/ the results when avail...  Today we checked for Low-T & gave you some Cialis 20mg  to try as we discussed...  Call for any questions.Francisco KitchenMarland Price

## 2013-06-29 ENCOUNTER — Encounter: Payer: Self-pay | Admitting: Pulmonary Disease

## 2013-06-29 NOTE — Progress Notes (Signed)
Subjective:     Patient ID: Francisco Price, male   DOB: [birth year], 62 y.o.   MRN: [id number]  HPI 62 y/o WM here for a follow up visit... he enjoys excellent general medical health & only takes Simva40 & an Aspirin daily... 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  ~  October 14, 2011:  34mo ROV & Francisco Price tells me he is just here to get his med refilled, doesn't want a physical or general check up & review...    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   We discussed options for his low HDL> diet & exercise, Niacin (INTOL in past w/ flushing), consider switch to Crestor...  He would like to try the Crestor & see if his TChol & LDL remain as good w/ improvement in the HDL on the diff statin med...  ~  April 29,2014:  Yearly ROV & CPX>  Francisco Price reports a good year, no new complaints or concerns other than some recent dental work... We reviewed the following medical problems during today's office visit >>     Chol> on Simva40; FLP from [facility] 2/14 showed TChol 126, TG 104, HDL 32, LDL 73    Colon polyps> hx of hyperplastic polyps removed by Francisco Price 1/04 & due for f/u colon now- refer to GI...    BPH> s/p vasectomy by Francisco Price in 2002, hx of mild LTOS- resolved & not on meds...    LBP, DDD> previously eval by Francisco Price w/ mild scoliosis & DDD- currently doing well w/o LBP...    Hx of panic attack> only one episode that occurred several years ago, no recurrence.     Derm>> pt mentioned bizarre hx of 3-4 slits in skin of left forehead that seems to occur spontaneously, lasts for a week then resolves on its own; denies trauma, occurs 2-3 times per year and patient does not have a clue (rec to take pic or come to office)... We reviewed prob list, meds, xrays and labs> see below for updates >>   CXR 4/14 showed normal heart size, clear lungs, WNL.Marland KitchenMarland Kitchen EKG 4/14 showed NSR, rate61, WNL LABS 4/14:  Chems- wnl x Cr=1.3;  CBC- wnl w/ Hg=17.0;  TSH=1.97;  PSA=1.06...  ~  June 28, 2013:  55mo ROV & add-on appt for several problems>> he refuses the flu vaccine, refuses f/u colonoscopy.Marland KitchenMarland Kitchen    1)  Francisco Price is c/o a 9d hx sore throat, cough, green sput, but no f/c/s, not aching/ sore, no CP, notes very sleepy/ tired;  Exam w/ pharyngeal erythema, no exud, scat rhonchi w/o consolidation;  Plan is to treat w/ Levaquin, Depo80, Pred dosepak, MMW...    2)  C/o vivid dreams "nonstop dreaming" and snoring per wife; states he rests ok, wakes refreshed but freq takes brief (<1h) nap at ~2PM; states he's ok if active, but sleeps "if I'm still"; he denies that he acts-out any of his dreams or sleepwalks but interestingly he did sleepwalk some as a child 6 so did his mother; we discussed obtaining a sleep study & Francisco Price suggested extra leads on the limbs to check for REM Behavior Disorder (RBD).Marland KitchenMarland Kitchen     3)  C/o low sex drive and ED> we discussed checking Testos level- Labs returned w/  Testos level = 389 (norm 350-890); and trial Cialis 20mg  prn... LABS 12/14:  Testos level = 389...          PROBLEM LIST:     HYPERCHOLESTEROLEMIA (ICD-272.0) - on SIMVASTATIN 40mg /d + low chol/ low fat diet... ~  FLP 11/07 showed TChol 139, TG 73, HDL 31, LDL 93 ~  FLP 1/09 showed TChol 158, TG 89, HDL 32, LDL 108 ~  FLP 3/10 showed TChol 125, TG 67, HDL 30, LDL 82 ~  FLP 11/11 ==> wasn't done... ~  FLP 2/13 at [facility] on Simva40 showed TChol 115, TG 104, HDL 29, LDL 65... He would like to try Cres20. ~  FLP 2/14 at [facility] on Simva40 showed  TChol 126, TG 104, HDL 32, LDL 73  UMBILICAL HERNIA (ZOX-096.0) - CTAbd 11/08 showed sm umbil hernia w/ area of fat necrosis... his pain resolved spontaneously and he was checked by Francisco Price... no need for surg.  COLONIC POLYPS (ICD-211.3) - last colonoscopy was 1/04 by Francisco Price and showed  mult small polyps 2-3 mm size... Path=hyperplastic. ~  4/14:  Referred to GI for f/u colonoscopy... 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   BENIGN PROSTATIC HYPERTROPHY, MILD, HX OF (ICD-V13.8) - S/P vasectomy by Francisco Price 2002... prev LTOS resolved & he no longer takes the FLomax.  DEGENERATIVE DISC DISEASE (ICD-722.6) - he had Patella surg in 1975... known to have mild scoliosis and DDD from eval by Francisco Price...   Hx of PANIC ATTACK (ICD-300.01) - ppt by phone call (wrong number) in the middle of the night.   Past Surgical History  Procedure Laterality Date  . Knee arthroscopy       Outpatient Encounter Prescriptions as of 06/28/2013  Medication Sig  . aspirin 81 MG tablet Take 81 mg by mouth daily.  Marland Kitchen omeprazole (PRILOSEC OTC) 20 MG tablet Take 20 mg by mouth daily.  . simvastatin (ZOCOR) 40 MG tablet TAKE 1 TABLET AT BEDTIME  . Diphenhyd-Hydrocort-Nystatin (FIRST-DUKES MOUTHWASH) SUSP 1 tsp gargle and swallow four times daily as needed  . levofloxacin (LEVAQUIN) 500 MG tablet Take 1 tablet (500 mg total) by mouth daily.  . predniSONE (STERAPRED UNI-PAK) 5 MG TABS tablet Take as directed  . [EXPIRED] methylPREDNISolone acetate (DEPO-MEDROL) injection 80 mg     Allergies  Allergen Reactions  . Niacin     REACTION: intol w/ flushing    Current Medications, Allergies, Past Medical History, Past Surgical History, Family History, and Social History were reviewed in [employer] record.   Review of Systems    The patient denies fever, chills, sweats, anorexia, fatigue, weakness, malaise, weight loss, sleep disorder, blurring, diplopia, eye irritation, eye discharge, vision loss, eye pain, photophobia, earache, ear discharge,  tinnitus, decreased hearing, nasal congestion, nosebleeds, sore throat, hoarseness, chest pain, palpitations, syncope, dyspnea on exertion, orthopnea, PND, peripheral edema, cough, dyspnea at rest, excessive sputum, hemoptysis, wheezing, pleurisy, nausea, vomiting, diarrhea, constipation, change in bowel habits, abdominal pain, melena, hematochezia, jaundice, gas/bloating, indigestion/heartburn, dysphagia, odynophagia, dysuria, hematuria, urinary frequency, urinary hesitancy, nocturia, incontinence, back pain, joint pain, joint swelling, muscle cramps, muscle weakness, stiffness, arthritis, sciatica, restless legs, leg pain at night, leg pain with exertion, rash, itching, dryness, suspicious lesions, paralysis, paresthesias, seizures, tremors, vertigo, transient blindness, frequent falls,  frequent headaches, difficulty walking, depression, anxiety, memory loss, confusion, cold intolerance, heat intolerance, polydipsia, polyphagia, polyuria, unusual weight change, abnormal bruising, bleeding, enlarged lymph nodes, urticaria, allergic rash, hay fever, and recurrent infections.     Objective:   Physical Exam      62 y/o WM in NAD... GENERAL:  Alert & oriented; pleasant & cooperative. HEENT:  [place]/AT, EOM-full, PERRLA, Fundi-benign, EACs-clear, TMs-wnl, NOSE-clear, THROAT-clear & wnl. NECK:  Supple, no JVD; normal carotid impulses w/o bruits; no thyromegaly or nodules palpated; no lymphadenopathy. CHEST:  Clear to P & A; without wheezes/ rales/ or rhonchi. HEART:  Regular Rhythm; without murmurs/ rubs/ or gallops. ABDOMEN:  Soft & nontender; normal bowel sounds; no organomegaly or masses detected. EXT: without deformities or arthritic changes; no varicose veins/ venous insuffic/ or edema. NEURO:  CN's intact; motor testing normal; sensory testing normal; gait normal & balance OK. DERM:  no lesions noted...  RADIOLOGY DATA:  Reviewed in the EPIC EMR & discussed w/ the patient...  LABORATORY DATA:   Reviewed in the EPIC EMR & discussed w/ the patient...     Assessment:     12/14> c/o URI, snoring & vivid dreams, low drive & ED==> we decided to treat w/ Levaquin, Depo80, Dosepak, MMW; refer for sleep study, and check for Low-T (Testos=389), try Cialis...  Hypercholesterolemia> stable on Simva 40.   Hx umbilical hernia> small umbilical hernia, asymptomatic.   Hx colon polyps> last colon 2004 by Dr. Marina Goodell, due for f/u and we made referral but pt cancellled & doesn't want colon at this time...  Hx BPH> S/P vasectomy 2002 by Dr. Annabell Howells.  Denied LTOS and doing well without meds.   Hx DJD> hx of mild DDD and previously eval by Dr. Sherlean Foot and he denies current symptoms.   Hx panic attacks> 1 episode years ago and no occurrence.      Plan:     Patient's Medications  New Prescriptions   DIPHENHYD-HYDROCORT-NYSTATIN (FIRST-DUKES MOUTHWASH) SUSP    1 tsp gargle and swallow four times daily as needed   LEVOFLOXACIN (LEVAQUIN) 500 MG TABLET    Take 1 tablet (500 mg total) by mouth daily.   PREDNISONE (STERAPRED UNI-PAK) 5 MG TABS TABLET    Take as directed  Previous Medications   ASPIRIN 81 MG TABLET    Take 81 mg by mouth daily.   OMEPRAZOLE (PRILOSEC OTC) 20 MG TABLET    Take 20 mg by mouth daily.   SIMVASTATIN (ZOCOR) 40 MG TABLET    TAKE 1 TABLET AT BEDTIME  Modified Medications   No medications on file  Discontinued Medications   No medications on file

## 2013-07-26 ENCOUNTER — Telehealth: Payer: Self-pay | Admitting: Pulmonary Disease

## 2013-07-26 MED ORDER — METHYLPREDNISOLONE 8 MG PO TABS: ORAL_TABLET | ORAL | Status: DC

## 2013-07-26 MED ORDER — SULFAMETHOXAZOLE-TMP DS 800-160 MG PO TABS: 1.0000 | ORAL_TABLET | Freq: Two times a day (BID) | ORAL | Status: DC

## 2013-07-26 NOTE — Telephone Encounter (Signed)
Called and spoke with pt and he stated that he did get some better after taking the last abx, but once finished all the symptoms came back.  Pt is aware of SN recs and will start on these today and call back for any further questions.  Will sign off of message but will forward back to SN to make him aware of pts response.  Nothing further is needed.

## 2013-07-26 NOTE — Telephone Encounter (Signed)
Pt c/o increased head cold symptoms, sneezing, and cough with some Laurita Peron mucus Seen by SN x 3 weeks ago Has tried Mucinex OTC--no improvement  Pt reports has completed all abx and pred given last visit  [facility] Allergies  Allergen Reactions  . Niacin     REACTION: intol w/ flushing   Please advise Dr Lenna Gilford. Thanks.

## 2013-07-26 NOTE — Telephone Encounter (Signed)
Per SN---  Did he get better and then get worse?  Did he not respond to the previous rx?  Call in septra ds  #20   1 po bid Medrol 8 mg   #15 1 po bid x 4 days 1 daily x 4 days 1/2 daily until gone   lmomtcb

## 2013-07-26 NOTE — Telephone Encounter (Signed)
Returning call.

## 2013-07-29 ENCOUNTER — Ambulatory Visit (HOSPITAL_BASED_OUTPATIENT_CLINIC_OR_DEPARTMENT_OTHER): Payer: BC Managed Care – PPO | Attending: Pulmonary Disease

## 2013-08-12 ENCOUNTER — Telehealth: Payer: Self-pay | Admitting: Pulmonary Disease

## 2013-08-12 MED ORDER — FLUTICASONE PROPIONATE 50 MCG/ACT NA SUSP: 1.0000 | NASAL | Status: DC

## 2013-08-12 MED ORDER — METHYLPREDNISOLONE 4 MG PO KIT: PACK | ORAL | Status: DC

## 2013-08-12 MED ORDER — LEVOFLOXACIN 500 MG PO TABS: 500.0000 mg | ORAL_TABLET | Freq: Every day | ORAL | Status: DC

## 2013-08-12 NOTE — Telephone Encounter (Signed)
Per SN--  We can treat him again if he wants with   levaquin 500 mg  1 daily x 10 days Align once daily Medrol dosepak Add flonase 2 sprays in each nostril BID Saline 1-2 sprays ever 2 hours while awake  --OR--  We can refer him to ENT for their eval of all the sinus problems.  thanks

## 2013-08-12 NOTE — Telephone Encounter (Signed)
Pt is aware of SN recs. He will take another round of antibiotic and steroids. If this doesn't help, he will call back and we will refer him to ENT.

## 2013-08-12 NOTE — Telephone Encounter (Signed)
Spoke with pt. Reports cough with production of green mucus, possible fever and severe sinus congestion x2 months. We have given the pt a pred taper and antibiotics, they relieve symptoms while he is taking but as soon as he finishes his symptoms come right back. Wants to know what else he can do.  Allergies  Allergen Reactions  . Niacin     REACTION: intol w/ flushing    SN - please advise. Thanks.

## 2013-09-04 ENCOUNTER — Other Ambulatory Visit: Payer: Self-pay

## 2013-09-04 MED ORDER — FLUTICASONE PROPIONATE 50 MCG/ACT NA SUSP: 1.0000 | Freq: Two times a day (BID) | NASAL | Status: DC

## 2013-10-20 ENCOUNTER — Other Ambulatory Visit: Payer: Self-pay | Admitting: Pulmonary Disease

## 2014-04-07 ENCOUNTER — Other Ambulatory Visit: Payer: Self-pay | Admitting: Pulmonary Disease

## 2014-05-26 ENCOUNTER — Telehealth: Payer: Self-pay | Admitting: Pulmonary Disease

## 2014-05-26 NOTE — Telephone Encounter (Signed)
Rx was called [facility]  I spoke with the pt's spouse and notified that this was done  Nothing further needed

## 2014-05-26 NOTE — Telephone Encounter (Signed)
Called and spoke with pts wife and she stated that they are flying to [place] the end of the week and he gets very anxious when flying and she wanted to see if SN would call in a few xanax to help him get through his flight and back home.  SN please advise. Thanks  Allergies  Allergen Reactions  . Niacin     REACTION: intol w/ flushing    Current Outpatient Prescriptions on File Prior to Visit  Medication Sig Dispense Refill  . aspirin 81 MG tablet Take 81 mg by mouth daily.    . Diphenhyd-Hydrocort-Nystatin (FIRST-DUKES MOUTHWASH) SUSP 1 tsp gargle and swallow four times daily as needed 120 mL 5  . fluticasone (FLONASE) 50 MCG/ACT nasal spray Place 1-2 sprays into both nostrils 2 (two) times daily. 48 g 1  . levofloxacin (LEVAQUIN) 500 MG tablet Take 1 tablet (500 mg total) by mouth daily. 10 tablet 0  . methylPREDNISolone (MEDROL DOSEPAK) 4 MG tablet follow package directions 21 tablet 0  . methylPREDNISolone (MEDROL) 8 MG tablet 1 tablet two times daily x 4 days,  1 tablet daily x 4 days, 1/2 tablet daily until gone 15 tablet 0  . omeprazole (PRILOSEC OTC) 20 MG tablet Take 20 mg by mouth daily.    . predniSONE (STERAPRED UNI-PAK) 5 MG TABS tablet Take as directed 1 each 0  . simvastatin (ZOCOR) 40 MG tablet TAKE 1 TABLET AT BEDTIME 90 tablet 0  . sulfamethoxazole-trimethoprim (BACTRIM DS) 800-160 MG per tablet Take 1 tablet by mouth 2 (two) times daily. 20 tablet 0   No current facility-administered medications on file prior to visit.

## 2014-05-26 NOTE — Telephone Encounter (Signed)
Per SN: yes, Xanax 0.5mg  #30, 1 po q6prn anxiety.  No refills.

## 2014-08-26 ENCOUNTER — Ambulatory Visit: Payer: Self-pay | Admitting: Pulmonary Disease

## 2015-02-05 ENCOUNTER — Ambulatory Visit (INDEPENDENT_AMBULATORY_CARE_PROVIDER_SITE_OTHER)
Admission: RE | Admit: 2015-02-05 | Discharge: 2015-02-05 | Disposition: A | Discharge status: Home / Self Care | Payer: Managed Care, Other (non HMO) | Source: Ambulatory Visit | Attending: Pulmonary Disease | Admitting: Pulmonary Disease

## 2015-02-05 ENCOUNTER — Ambulatory Visit (INDEPENDENT_AMBULATORY_CARE_PROVIDER_SITE_OTHER): Payer: Managed Care, Other (non HMO) | Admitting: Pulmonary Disease

## 2015-02-05 ENCOUNTER — Encounter: Payer: Self-pay | Admitting: Pulmonary Disease

## 2015-02-05 ENCOUNTER — Other Ambulatory Visit (INDEPENDENT_AMBULATORY_CARE_PROVIDER_SITE_OTHER): Payer: Managed Care, Other (non HMO)

## 2015-02-05 VITALS — BP 132/90 | HR 59 | Temp 96.8°F | Wt 198.2 lb

## 2015-02-05 DIAGNOSIS — Z0000 Encounter for general adult medical examination without abnormal findings: Secondary | ICD-10-CM

## 2015-02-05 DIAGNOSIS — D126 Benign neoplasm of colon, unspecified: Secondary | ICD-10-CM | POA: Diagnosis not present

## 2015-02-05 DIAGNOSIS — E7800 Pure hypercholesterolemia, unspecified: Secondary | ICD-10-CM

## 2015-02-05 DIAGNOSIS — N401 Enlarged prostate with lower urinary tract symptoms: Secondary | ICD-10-CM

## 2015-02-05 DIAGNOSIS — N138 Other obstructive and reflux uropathy: Secondary | ICD-10-CM

## 2015-02-05 DIAGNOSIS — Z7689 Persons encountering health services in other specified circumstances: Secondary | ICD-10-CM | POA: Insufficient documentation

## 2015-02-05 LAB — BASIC METABOLIC PANEL
BUN: 19 mg/dL (ref 6–23)
CHLORIDE: 104 meq/L (ref 96–112)
CO2: 28 meq/L (ref 19–32)
CREATININE: 1.37 mg/dL (ref 0.40–1.50)
Calcium: 10.2 mg/dL (ref 8.4–10.5)
GFR: 55.47 mL/min — ABNORMAL LOW (ref >60.00)
Glucose, Bld: 97 mg/dL (ref 70–99)
Potassium: 4.9 mEq/L (ref 3.5–5.1)
Sodium: 140 mEq/L (ref 135–145)

## 2015-02-05 LAB — PSA: PSA: 1.6 ng/mL (ref 0.10–4.00)

## 2015-02-05 LAB — HEPATIC FUNCTION PANEL
ALK PHOS: 71 U/L (ref 39–117)
ALT: 20 U/L (ref 0–53)
AST: 19 U/L (ref 0–37)
Albumin: 4.8 g/dL (ref 3.5–5.2)
BILIRUBIN TOTAL: 0.9 mg/dL (ref 0.2–1.2)
Bilirubin, Direct: 0.2 mg/dL (ref 0.0–0.3)
TOTAL PROTEIN: 7.2 g/dL (ref 6.0–8.3)

## 2015-02-05 LAB — CBC WITH DIFFERENTIAL/PLATELET
BASOS ABS: 0 10*3/uL (ref 0.0–0.1)
BASOS PCT: 0.3 % (ref 0.0–3.0)
EOS PCT: 2.6 % (ref 0.0–5.0)
Eosinophils Absolute: 0.1 10*3/uL (ref 0.0–0.7)
HCT: 50.7 % (ref 39.0–52.0)
HEMOGLOBIN: 17.4 g/dL — AB (ref 13.0–17.0)
Lymphocytes Relative: 26.6 % (ref 12.0–46.0)
Lymphs Abs: 1.5 10*3/uL (ref 0.7–4.0)
MCHC: 34.4 g/dL (ref 30.0–36.0)
MCV: 91.3 fl (ref 78.0–100.0)
MONO ABS: 0.5 10*3/uL (ref 0.1–1.0)
Monocytes Relative: 7.8 % (ref 3.0–12.0)
Neutro Abs: 3.6 10*3/uL (ref 1.4–7.7)
Neutrophils Relative %: 62.7 % (ref 43.0–77.0)
Platelets: 186 10*3/uL (ref 150.0–400.0)
RBC: 5.55 Mil/uL (ref 4.22–5.81)
RDW: 12.5 % (ref 11.5–15.5)
WBC: 5.8 10*3/uL (ref 4.0–10.5)

## 2015-02-05 LAB — LIPID PANEL
Cholesterol: 171 mg/dL (ref 0–200)
HDL: 35.9 mg/dL — ABNORMAL LOW (ref >39.00)
LDL Cholesterol: 110 mg/dL — ABNORMAL HIGH (ref 0–99)
NONHDL: 135.1
TRIGLYCERIDES: 127 mg/dL (ref 0.0–149.0)
Total CHOL/HDL Ratio: 5
VLDL: 25.4 mg/dL (ref 0.0–40.0)

## 2015-02-05 LAB — TSH: TSH: 2.72 u[IU]/mL (ref 0.35–4.50)

## 2015-02-05 MED ORDER — SIMVASTATIN 40 MG PO TABS: 40.0000 mg | ORAL_TABLET | Freq: Every day | ORAL | Status: DC

## 2015-02-05 NOTE — Patient Instructions (Signed)
[name], it was great seeing you again, say hey to [name] for [place]!!!  Today we did your follow up CXR, EKG, FASTING blood work...    We will contact you w/ the results when available...   We will set up a follow up appt w/ DrPerry for your colonoscopy due now...  Keep up the good work w/ diet & exercise (except on your cruises!).Marland KitchenMarland Kitchen  Call for any questions...  Let's plan a follow up visit in 101yr, sooner if needed for problems.Marland KitchenMarland Kitchen

## 2015-02-05 NOTE — Progress Notes (Addendum)
Subjective:     Patient ID: Francisco Price, male   DOB: 03-20-1951, 64 y.o.   MRN: [id number]  HPI 64 y/o WM here for a follow up visit... he enjoys excellent general medical health & only takes Simva40 & an Aspirin daily... 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  ~  October 14, 2011:  55mo ROV & Francisco Price tells me he is just here to get his med refilled, doesn't want a physical or general check up & review...    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   We discussed options for his low HDL> diet & exercise, Niacin (INTOL in past w/ flushing), consider switch to Crestor...  He would like to try the Crestor & see if his TChol & LDL remain as good w/ improvement in the HDL on the diff statin med...  ~  April 29,2014:  Yearly ROV & CPX>  Francisco Price reports a good year, no new complaints or concerns other than some recent dental work... We reviewed the following medical problems during today's office visit >>     Chol> on Simva40; FLP from [place] 2/14 showed TChol 126, TG 104, HDL 32, LDL 73    Colon polyps> hx of hyperplastic polyps removed by DrPerry 1/04 & due for f/u colon now- refer to GI...    BPH> s/p vasectomy by DrWrenn in 2002, hx of mild LTOS- resolved & not on meds...    LBP, DDD> previously eval by DrLucey w/ mild scoliosis & DDD- currently doing well w/o LBP...    Hx of panic attack> only one episode that occurred several years ago, no recurrence.     Derm>> pt mentioned bizarre hx of 3-4 slits in skin of left forehead that seems to occur spontaneously, lasts for a week then resolves on its own; denies trauma, occurs 2-3 times per year and patient does not have a clue (rec to take pic or come to office)... We reviewed prob list, meds, xrays and labs> see below for updates >>    CXR 4/14 showed normal heart size, clear lungs, WNL.Marland KitchenMarland Kitchen  EKG 4/14 showed NSR, rate61, WNL  LABS 4/14:  Chems- wnl x Cr=1.3;  CBC- wnl w/ Hg=17.0;  TSH=1.97;  PSA=1.06...  ~  June 28, 2013:  39mo ROV & add-on appt for several problems>> he refuses the flu vaccine, refuses f/u colonoscopy.Marland KitchenMarland Kitchen    1)  Francisco Price is c/o a 9d hx sore throat, cough, green sput, but no f/c/s, not aching/ sore, no CP, notes very sleepy/ tired;  Exam w/ pharyngeal erythema, no exud, scat rhonchi w/o consolidation; treat w/ Levaquin, Depo80, Pred dosepak, MMW.    2)  C/o vivid dreams "nonstop dreaming" and snoring per wife; states he rests ok, wakes refreshed but freq takes brief (<1h) nap at ~2PM; states he's ok if active, but sleeps "if I'm still"; he denies that he acts-out any of his dreams or sleepwalks but interestingly he did sleepwalk some as a child 6 so did his mother; we discussed obtaining a sleep study & DrClance suggested extra leads on the limbs to check for REM Behavior Disorder (RBD).Marland KitchenMarland Kitchen     3)  C/o low sex drive and ED> we discussed checking Testos level- Labs returned w/ [facility]  level = 389 (norm 350-890); and trial Cialis 20mg  prn...  LABS 12/14:  Testos level = 389...  ~  February 05, 2015:  26mo ROV and CPX> Francisco Price reports doing well, no new complaints or concerns, he has retired from his job w/ the [employer]; wife [name] is now working w/ a [employer] doing home Ig infusions... We reviewed the following medical problems during today's office visit >>     Chol> on Simva40 & diet; FLP 7/16 showed TChol 171, TG 127, HDL 36, LDL 110 ==> turns out he was on diet alone, no Simva for ~20yr, therefore OK on diet alone.    Colon polyps> hx of hyperplastic polyps removed by DrPerry 1/04 & overdue for f/u colon now- we will refer to GI...    BPH> s/p vasectomy by DrWrenn in 2002, hx of mild LTOS- resolved & not on meds, PSA remains wnl...    LBP, DDD> previously eval by DrLucey w/ mild scoliosis & DDD- currently doing well w/o  LBP; Hx right shoulder injection by DrNorris 3/15, improved...    Hx of panic attack> only one episode that occurred several years ago, no recurrence ==> until 02/10/15 as below...    Derm>> pt mentioned bizarre hx of 3-4 slits in skin of left forehead that seems to occur spontaneously, lasts for a week then resolves on its own; denies trauma, occurs 2-3 times per year and patient does not have a clue (rec to take pic or come to office so we can observe the phenomenon). EXAM reveals Afeb, VSS, O2sat=100% on RA;  HEENT- neg, mallampati1;  Chest- clear w/o w/r/r;  Heart- RR w/o m/r/g;  Abd- soft, neg;  Ext- neg w/o c/c/e...   CXR 02/05/15 showed norm heart size, clear lungs, mild degen arthritis...  EKG 02/05/15 showed NSR, rate69, wnl...  LABS 7/16:  FLP- ok on Simva40 x LDL=110;  Chems- ok w/ Cr=1.37;  CBC- wnl;  TSH=2.72;  PSA=1.60... IMP/PLAN>>  Doing satis; continue same meds, diet, exercise;  Francisco Price needs f/u DrPerry for colonoscopy...   ADDENDUM>>  Pt called 7/19 c/o heart racing, feeling up-tight, can't relax- thought it was the Simva40 ("just put on Chol med"- but he's been on it for yrs & recent FLP was good); it turns out that he had stopped the Simva40 ~19yr ago & the recent FLP was essentially on diet alone; OK to stop the Simva (doesn't need it), but this doesn't account for his reaction; he has hx panic attack & wife indicates some recent stress w/ grandkids, had Redbull yest, etc;  She will give him Alpraz0.5 and call for any further issues...           PROBLEM LIST:     HYPERCHOLESTEROLEMIA (ICD-272.0) - on SIMVASTATIN 40mg /d + low chol/ low fat diet... ~  [place] 11/07 showed TChol 139, TG 73, HDL 31, LDL 93 ~  FLP 1/09 showed TChol 158, TG 89, HDL 32, LDL 108 ~  FLP 3/10 showed TChol 125, TG 67, HDL 30, LDL 82 ~  FLP 11/11 ==> wasn't done... ~  FLP 2/13 at [place] on Simva40 showed TChol 115, TG 104, HDL 29, LDL 65... He would like to try [place]. ~  FLP 2/14 at [place] on [place] showed  TChol 126, TG 104, HDL 32, LDL 73 ~  FLP 7/16 on Simva40 showed TChol 171, TG 127, HDL 36, LDL 409  UMBILICAL HERNIA (WJX-914.7) - CTAbd 11/08 showed sm umbil hernia w/ area of fat necrosis... his pain resolved spontaneously  and he was checked by DrGerkin... no need for surg.  COLONIC POLYPS (ICD-211.3) - last colonoscopy was 1/04 by DrPerry and showed mult small polyps 2-3 mm size... Path=hyperplastic. ~  4/14:  Referred to GI for f/u colonoscopy... 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  ~  he is overdue for colonoscopy & we will refer back to DrPerry...  BENIGN PROSTATIC HYPERTROPHY, MILD, HX OF (ICD-V13.8) - S/P vasectomy by DrWrenn 2002... prev LTOS resolved & he no longer takes the FLomax. ~  PSA remains wnl...  DEGENERATIVE DISC DISEASE (ICD-722.6) - he had Patella surg in 1975... known to have mild scoliosis and DDD from eval by DrLucey...   Hx of PANIC ATTACK (ICD-300.01) - ppt by phone call (wrong number) in the middle of the night.   Past Surgical History  Procedure Laterality Date  . Knee arthroscopy       Outpatient Encounter Prescriptions as of 02/05/2015  Medication Sig  . aspirin 81 MG tablet Take 81 mg by mouth daily.  Marland Kitchen esomeprazole (NEXIUM) 20 MG capsule Take 20 mg by mouth daily at 12 noon.  . fluticasone (FLONASE) 50 MCG/ACT nasal spray Place 1-2 sprays into both nostrils 2 (two) times daily. (Patient taking differently: Place 1-2 sprays into both nostrils 2 (two) times daily as needed. )  . Multiple Vitamins-Minerals (MULTIVITAMIN WITH MINERALS) tablet Take 1 tablet by mouth daily.  . simvastatin (ZOCOR) 40 MG tablet Take 1 tablet (40 mg total) by mouth at bedtime.  . [DISCONTINUED] Diphenhyd-Hydrocort-Nystatin (FIRST-DUKES  MOUTHWASH) SUSP 1 tsp gargle and swallow four times daily as needed  . [DISCONTINUED] levofloxacin (LEVAQUIN) 500 MG tablet Take 1 tablet (500 mg total) by mouth daily.  . [DISCONTINUED] methylPREDNISolone (MEDROL DOSEPAK) 4 MG tablet follow package directions  . [DISCONTINUED] methylPREDNISolone (MEDROL) 8 MG tablet 1 tablet two times daily x 4 days,  1 tablet daily x 4 days, 1/2 tablet daily until gone  . [DISCONTINUED] omeprazole (PRILOSEC OTC) 20 MG tablet Take 20 mg by mouth daily.  . [DISCONTINUED] predniSONE (STERAPRED UNI-PAK) 5 MG TABS tablet Take as directed  . [DISCONTINUED] simvastatin (ZOCOR) 40 MG tablet TAKE 1 TABLET AT BEDTIME (Patient not taking: Reported on 02/05/2015)  . [DISCONTINUED] sulfamethoxazole-trimethoprim (BACTRIM DS) 800-160 MG per tablet Take 1 tablet by mouth 2 (two) times daily.   No facility-administered encounter medications on file as of 02/05/2015.    Allergies  Allergen Reactions  . Niacin     REACTION: intol w/ flushing    Current Medications, Allergies, Past Medical History, Past Surgical History, Family History, and Social History were reviewed in [employer] record.   Review of Systems    The patient denies fever, chills, sweats, anorexia, fatigue, weakness, malaise, weight loss, sleep disorder, blurring, diplopia, eye irritation, eye discharge, vision loss, eye pain, photophobia, earache, ear discharge, tinnitus, decreased hearing, nasal congestion, nosebleeds, sore throat, hoarseness, chest pain, palpitations, syncope, dyspnea on exertion, orthopnea, PND, peripheral edema, cough, dyspnea at rest, excessive sputum, hemoptysis, wheezing, pleurisy, nausea, vomiting, diarrhea, constipation, change in bowel habits, abdominal pain, melena, hematochezia, jaundice, gas/bloating, indigestion/heartburn, dysphagia, odynophagia, dysuria, hematuria, urinary frequency, urinary hesitancy, nocturia, incontinence, back pain, joint pain, joint  swelling, muscle cramps, muscle weakness, stiffness, arthritis, sciatica, restless legs, leg pain at  night, leg pain with exertion, rash, itching, dryness, suspicious lesions, paralysis, paresthesias, seizures, tremors, vertigo, transient blindness, frequent falls, frequent headaches, difficulty walking, depression, anxiety, memory loss, confusion, cold intolerance, heat intolerance, polydipsia, polyphagia, polyuria, unusual weight change, abnormal bruising, bleeding, enlarged lymph nodes, urticaria, allergic rash, hay fever, and recurrent infections.     Objective:   Physical Exam      64 y/o WM in NAD... GENERAL:  Alert & oriented; pleasant & cooperative. HEENT:  [place]/AT, EOM-full, PERRLA, Fundi-benign, EACs-clear, TMs-wnl, NOSE-clear, THROAT-clear & wnl. NECK:  Supple, no JVD; normal carotid impulses w/o bruits; no thyromegaly or nodules palpated; no lymphadenopathy. CHEST:  Clear to P & A; without wheezes/ rales/ or rhonchi. HEART:  Regular Rhythm; without murmurs/ rubs/ or gallops. ABDOMEN:  Soft & nontender; normal bowel sounds; no organomegaly or masses detected. EXT: without deformities or arthritic changes; no varicose veins/ venous insuffic/ or edema. NEURO:  CN's intact; motor testing normal; sensory testing normal; gait normal & balance OK. DERM:  no lesions noted...  RADIOLOGY DATA:  Reviewed in the EPIC EMR & discussed w/ the patient...  LABORATORY DATA:  Reviewed in the EPIC EMR & discussed w/ the patient...     Assessment:      Hypercholesterolemia> stable on Simva 40.   Hx umbilical hernia> small umbilical hernia, asymptomatic.   Hx colon polyps> last colon 2004 by Dr. Henrene Pastor, due for f/u and we made referral but pt cancellled & doesn't want colon at this time...  Hx BPH> S/P vasectomy 2002 by Dr. Jeffie Pollock.  Denied LTOS and doing well without meds.   Hx DJD> hx of mild DDD and previously eval by Dr. Ronnie Derby and he denies current symptoms.   Hx panic attacks> 1 episode  years ago and no occurrence.      Plan:     Patient's Medications  New Prescriptions   No medications on file  Previous Medications   ASPIRIN 81 MG TABLET    Take 81 mg by mouth daily.   ESOMEPRAZOLE (NEXIUM) 20 MG CAPSULE    Take 20 mg by mouth daily at 12 noon.   FLUTICASONE (FLONASE) 50 MCG/ACT NASAL SPRAY    Place 1-2 sprays into both nostrils 2 (two) times daily.   MULTIPLE VITAMINS-MINERALS (MULTIVITAMIN WITH MINERALS) TABLET    Take 1 tablet by mouth daily.  Modified Medications   Modified Medication Previous Medication   SIMVASTATIN (ZOCOR) 40 MG TABLET simvastatin (ZOCOR) 40 MG tablet      Take 1 tablet (40 mg total) by mouth at bedtime.    TAKE 1 TABLET AT BEDTIME  Discontinued Medications   DIPHENHYD-HYDROCORT-NYSTATIN (FIRST-DUKES MOUTHWASH) SUSP    1 tsp gargle and swallow four times daily as needed   LEVOFLOXACIN (LEVAQUIN) 500 MG TABLET    Take 1 tablet (500 mg total) by mouth daily.   METHYLPREDNISOLONE (MEDROL DOSEPAK) 4 MG TABLET    follow package directions   METHYLPREDNISOLONE (MEDROL) 8 MG TABLET    1 tablet two times daily x 4 days,  1 tablet daily x 4 days, 1/2 tablet daily until gone   OMEPRAZOLE (PRILOSEC OTC) 20 MG TABLET    Take 20 mg by mouth daily.   PREDNISONE (STERAPRED UNI-PAK) 5 MG TABS TABLET    Take as directed   SULFAMETHOXAZOLE-TRIMETHOPRIM (BACTRIM DS) 800-160 MG PER TABLET    Take 1 tablet by mouth 2 (two) times daily.

## 2015-02-10 ENCOUNTER — Telehealth: Payer: Self-pay | Admitting: Pulmonary Disease

## 2015-02-10 NOTE — Telephone Encounter (Signed)
Called spoke with pt. He reports he started the simvastatin 02/05/15 and reports his heart has been racing and he feels "tight" all over his body. Please advise SN thanks  Allergies  Allergen Reactions  . Niacin     REACTION: intol w/ flushing     Current Outpatient Prescriptions on File Prior to Visit  Medication Sig Dispense Refill  . aspirin 81 MG tablet Take 81 mg by mouth daily.    Marland Kitchen esomeprazole (NEXIUM) 20 MG capsule Take 20 mg by mouth daily at 12 noon.    . fluticasone (FLONASE) 50 MCG/ACT nasal spray Place 1-2 sprays into both nostrils 2 (two) times daily. (Patient taking differently: Place 1-2 sprays into both nostrils 2 (two) times daily as needed. ) 48 g 1  . Multiple Vitamins-Minerals (MULTIVITAMIN WITH MINERALS) tablet Take 1 tablet by mouth daily.    . simvastatin (ZOCOR) 40 MG tablet Take 1 tablet (40 mg total) by mouth at bedtime. 30 tablet 11   No current facility-administered medications on file prior to visit.

## 2015-02-10 NOTE — Telephone Encounter (Signed)
SN spoke with pt directly this afternoon in regards to possible medication reaction

## 2015-03-17 ENCOUNTER — Other Ambulatory Visit: Payer: Self-pay | Admitting: Pulmonary Disease

## 2015-04-23 ENCOUNTER — Other Ambulatory Visit: Payer: Self-pay | Admitting: Pulmonary Disease

## 2015-05-09 ENCOUNTER — Other Ambulatory Visit: Payer: Self-pay | Admitting: Pulmonary Disease

## 2015-05-13 NOTE — Telephone Encounter (Signed)
Pt requesting Xanax 0.5mg . Last OV 02/05/15. Next OV 02/05/16. Per 05/26/14 phone message Xanax 0.5mg  #30 tablets 1 PO q6PRN  0 refills was called in to the pharmacy.   NS please advise if ok to refill   Allergies  Allergen Reactions  . Niacin     REACTION: intol w/ flushing    Current Outpatient Prescriptions on File Prior to Visit  Medication Sig Dispense Refill  . aspirin 81 MG tablet Take 81 mg by mouth daily.    Marland Kitchen esomeprazole (NEXIUM) 20 MG capsule Take 20 mg by mouth daily at 12 noon.    . fluticasone (FLONASE) 50 MCG/ACT nasal spray Place 1-2 sprays into both nostrils 2 (two) times daily. (Patient taking differently: Place 1-2 sprays into both nostrils 2 (two) times daily as needed. ) 48 g 1  . Multiple Vitamins-Minerals (MULTIVITAMIN WITH MINERALS) tablet Take 1 tablet by mouth daily.    . simvastatin (ZOCOR) 40 MG tablet Take 1 tablet (40 mg total) by mouth at bedtime. 30 tablet 11   No current facility-administered medications on file prior to visit.

## 2015-10-01 ENCOUNTER — Encounter: Payer: Self-pay | Admitting: Internal Medicine

## 2015-10-01 ENCOUNTER — Ambulatory Visit (INDEPENDENT_AMBULATORY_CARE_PROVIDER_SITE_OTHER): Payer: Managed Care, Other (non HMO) | Admitting: Internal Medicine

## 2015-10-01 VITALS — BP 130/74 | HR 77 | Temp 97.7°F | Resp 12 | Ht 74.0 in | Wt 203.8 lb

## 2015-10-01 DIAGNOSIS — Z299 Encounter for prophylactic measures, unspecified: Secondary | ICD-10-CM

## 2015-10-01 DIAGNOSIS — Z418 Encounter for other procedures for purposes other than remedying health state: Secondary | ICD-10-CM | POA: Diagnosis not present

## 2015-10-01 DIAGNOSIS — Z0000 Encounter for general adult medical examination without abnormal findings: Secondary | ICD-10-CM

## 2015-10-01 DIAGNOSIS — Z23 Encounter for immunization: Secondary | ICD-10-CM | POA: Diagnosis not present

## 2015-10-01 MED ORDER — ALPRAZOLAM 0.5 MG PO TABS: 0.5000 mg | ORAL_TABLET | Freq: Four times a day (QID) | ORAL | Status: DC | PRN

## 2015-10-01 NOTE — Progress Notes (Signed)
   Subjective:    Patient ID: Francisco Price, male    DOB: [birth year], 65 y.o.   MRN: [id number]  HPI The patient is a 65 YO man coming in new for wellness. No complaints or concerns. Non-smoker. Exercises regularly. Some anxiety with travel and wants refill of xanax as going on a cruise next month. Does not get motion sickness with travel.   PMH, [facility], social history reviewed and updated.   Review of Systems  Constitutional: Negative for fever, activity change, appetite change, fatigue and unexpected weight change.  HENT: Negative.   Eyes: Negative.   Respiratory: Negative for cough, chest tightness, shortness of breath and wheezing.   Cardiovascular: Negative for chest pain, palpitations and leg swelling.  Gastrointestinal: Negative for nausea, abdominal pain, diarrhea, constipation and abdominal distention.  Musculoskeletal: Negative.   Skin: Negative.   Neurological: Negative.   Psychiatric/Behavioral: Negative.       Objective:   Physical Exam  Constitutional: He is oriented to person, place, and time. He appears well-developed and well-nourished.  HENT:  Head: Normocephalic and atraumatic.  Eyes: EOM are normal.  Neck: Normal range of motion.  Cardiovascular: Normal rate and regular rhythm.   Pulmonary/Chest: Effort normal and breath sounds normal. No respiratory distress. He has no wheezes. He has no rales.  Abdominal: Soft. Bowel sounds are normal. He exhibits no distension. There is no tenderness. There is no rebound.  Musculoskeletal: He exhibits no edema.  Neurological: He is alert and oriented to person, place, and time. Coordination normal.  Skin: Skin is warm and dry.  Psychiatric: He has a normal mood and affect.   Filed Vitals:   10/01/15 0903  BP: 130/74  Pulse: 77  Temp: 97.7 F (36.5 C)  TempSrc: Oral  Resp: 12  Height: 6\' 2"  (1.88 m)  Weight: 203 lb 12.8 oz (92.443 kg)  SpO2: 96%      Assessment & Plan:  Shingles shot given at visit.

## 2015-10-01 NOTE — Assessment & Plan Note (Signed)
Rx for xanax for travel, reviewed recent labs with him and no indication for change. Given shingles shot today, declines colonoscopy due to smell aversion to the prep and horrible experience with his first.

## 2015-10-01 NOTE — Progress Notes (Signed)
Pre visit review using our clinic review tool, if applicable. No additional management support is needed unless otherwise documented below in the visit note. 

## 2015-10-01 NOTE — Patient Instructions (Signed)
We have sent in the refill of the alprazolam to the pharmacy and given you the shingles shot.   Health Maintenance, Male A healthy lifestyle and preventative care can promote health and wellness.  Maintain regular health, dental, and eye exams.  Eat a healthy diet. Foods like vegetables, fruits, whole grains, low-fat dairy products, and lean protein foods contain the nutrients you need and are low in calories. Decrease your intake of foods high in solid fats, added sugars, and salt. Get information about a proper diet from your health care provider, if necessary.  Regular physical exercise is one of the most important things you can do for your health. Most adults should get at least 150 minutes of moderate-intensity exercise (any activity that increases your heart rate and causes you to sweat) each week. In addition, most adults need muscle-strengthening exercises on 2 or more days a week.   Maintain a healthy weight. The body mass index (BMI) is a screening tool to identify possible weight problems. It provides an estimate of body fat based on height and weight. Your health care provider can find your BMI and can help you achieve or maintain a healthy weight. For males 20 years and older:  A BMI below 18.5 is considered underweight.  A BMI of 18.5 to 24.9 is normal.  A BMI of 25 to 29.9 is considered overweight.  A BMI of 30 and above is considered obese.  Maintain normal blood lipids and cholesterol by exercising and minimizing your intake of saturated fat. Eat a balanced diet with plenty of fruits and vegetables. Blood tests for lipids and cholesterol should begin at age 34 and be repeated every 5 years. If your lipid or cholesterol levels are high, you are over age 75, or you are at high risk for heart disease, you may need your cholesterol levels checked more frequently.Ongoing high lipid and cholesterol levels should be treated with medicines if diet and exercise are not working.  If  you smoke, find out from your health care provider how to quit. If you do not use tobacco, do not start.  Lung cancer screening is recommended for adults aged 73-80 years who are at high risk for developing lung cancer because of a history of smoking. A yearly low-dose CT scan of the lungs is recommended for people who have at least a 30-pack-year history of smoking and are current smokers or have quit within the past 15 years. A pack year of smoking is smoking an average of 1 pack of cigarettes a day for 1 year (for example, a 30-pack-year history of smoking could mean smoking 1 pack a day for 30 years or 2 packs a day for 15 years). Yearly screening should continue until the smoker has stopped smoking for at least 15 years. Yearly screening should be stopped for people who develop a health problem that would prevent them from having lung cancer treatment.  If you choose to drink alcohol, do not have more than 2 drinks per day. One drink is considered to be 12 oz (360 mL) of beer, 5 oz (150 mL) of wine, or 1.5 oz (45 mL) of liquor.  Avoid the use of street drugs. Do not share needles with anyone. Ask for help if you need support or instructions about stopping the use of drugs.  High blood pressure causes heart disease and increases the risk of stroke. High blood pressure is more likely to develop in:  People who have blood pressure in the end of  the normal range (100-139/85-89 mm Hg).  People who are overweight or obese.  People who are African American.  If you are 54-39 years of age, have your blood pressure checked every 3-5 years. If you are 54 years of age or older, have your blood pressure checked every year. You should have your blood pressure measured twice--once when you are at a hospital or clinic, and once when you are not at a hospital or clinic. Record the average of the two measurements. To check your blood pressure when you are not at a hospital or clinic, you can use:  An automated  blood pressure machine at a pharmacy.  A home blood pressure monitor.  If you are 45-99 years old, ask your health care provider if you should take aspirin to prevent heart disease.  Diabetes screening involves taking a blood sample to check your fasting blood sugar level. This should be done once every 3 years after age 40 if you are at a normal weight and without risk factors for diabetes. Testing should be considered at a younger age or be carried out more frequently if you are overweight and have at least 1 risk factor for diabetes.  Colorectal cancer can be detected and often prevented. Most routine colorectal cancer screening begins at the age of 47 and continues through age 1. However, your health care provider may recommend screening at an earlier age if you have risk factors for colon cancer. On a yearly basis, your health care provider may provide home test kits to check for hidden blood in the stool. A small camera at the end of a tube may be used to directly examine the colon (sigmoidoscopy or colonoscopy) to detect the earliest forms of colorectal cancer. Talk to your health care provider about this at age 18 when routine screening begins. A direct exam of the colon should be repeated every 5-10 years through age 61, unless early forms of precancerous polyps or small growths are found.  People who are at an increased risk for hepatitis B should be screened for this virus. You are considered at high risk for hepatitis B if:  You were born in a country where hepatitis B occurs often. Talk with your health care provider about which countries are considered high risk.  Your parents were born in a high-risk country and you have not received a shot to protect against hepatitis B (hepatitis B vaccine).  You have HIV or AIDS.  You use needles to inject street drugs.  You live with, or have sex with, someone who has hepatitis B.  You are a man who has sex with other men (MSM).  You get  hemodialysis treatment.  You take certain medicines for conditions like cancer, organ transplantation, and autoimmune conditions.  Hepatitis C blood testing is recommended for all people born from [birth year] through 1966 and any individual with known risk factors for hepatitis C.  Healthy men should no longer receive prostate-specific antigen (PSA) blood tests as part of routine cancer screening. Talk to your health care provider about prostate cancer screening.  Testicular cancer screening is not recommended for adolescents or adult males who have no symptoms. Screening includes self-exam, a health care provider exam, and other screening tests. Consult with your health care provider about any symptoms you have or any concerns you have about testicular cancer.  Practice safe sex. Use condoms and avoid high-risk sexual practices to reduce the spread of sexually transmitted infections (STIs).  You should be screened  for STIs, including gonorrhea and chlamydia if:  You are sexually active and are younger than 24 years.  You are older than 24 years, and your health care provider tells you that you are at risk for this type of infection.  Your sexual activity has changed since you were last screened, and you are at an increased risk for chlamydia or gonorrhea. Ask your health care provider if you are at risk.  If you are at risk of being infected with HIV, it is recommended that you take a prescription medicine daily to prevent HIV infection. This is called pre-exposure prophylaxis (PrEP). You are considered at risk if:  You are a man who has sex with other men (MSM).  You are a heterosexual man who is sexually active with multiple partners.  You take drugs by injection.  You are sexually active with a partner who has HIV.  Talk with your health care provider about whether you are at high risk of being infected with HIV. If you choose to begin PrEP, you should first be tested for HIV. You should  then be tested every 3 months for as long as you are taking PrEP.  Use sunscreen. Apply sunscreen liberally and repeatedly throughout the day. You should seek shade when your shadow is shorter than you. Protect yourself by wearing long sleeves, pants, a wide-brimmed hat, and sunglasses year round whenever you are outdoors.  Tell your health care provider of new moles or changes in moles, especially if there is a change in shape or color. Also, tell your health care provider if a mole is larger than the size of a pencil eraser.  A one-time screening for abdominal aortic aneurysm (AAA) and surgical repair of large AAAs by ultrasound is recommended for men aged 31-75 years who are current or former smokers.  Stay current with your vaccines (immunizations).   This information is not intended to replace advice given to you by your health care provider. Make sure you discuss any questions you have with your health care provider.   Document Released: 01/07/2008 Document Revised: 08/01/2014 Document Reviewed: 12/06/2010 Elsevier Interactive Patient Education [employer].

## 2015-10-01 NOTE — Addendum Note (Signed)
Addended by: Resa Miner R on: 10/01/2015 02:34 PM   Modules accepted: Orders

## 2015-12-22 ENCOUNTER — Encounter: Payer: Self-pay | Admitting: Internal Medicine

## 2015-12-22 ENCOUNTER — Ambulatory Visit (INDEPENDENT_AMBULATORY_CARE_PROVIDER_SITE_OTHER): Payer: Managed Care, Other (non HMO) | Admitting: Internal Medicine

## 2015-12-22 VITALS — BP 122/78 | HR 72 | Resp 20 | Wt 204.0 lb

## 2015-12-22 DIAGNOSIS — L259 Unspecified contact dermatitis, unspecified cause: Secondary | ICD-10-CM

## 2015-12-22 MED ORDER — PREDNISONE 10 MG PO TABS: ORAL_TABLET | ORAL | Status: DC

## 2015-12-22 NOTE — Assessment & Plan Note (Signed)
Mild to mod, for depomedrol IM, predpac asd, benadryl cream otc,  to f/u any worsening symptoms or concerns

## 2015-12-22 NOTE — Progress Notes (Signed)
   Subjective:    Patient ID: Francisco Price, male    DOB: Feb 06, 1951, 65 y.o.   MRN: [id number]  HPI  Here with acute onset itchy rash to most of right arm anterior below elbow only, after working in the yard, believes he may need an antibiotic, but no rash, worsening erythema, worsening pain, fever or chills.  Pt denies chest pain, increased sob or doe, wheezing, orthopnea, PND, increased LE swelling, palpitations, dizziness or syncope.  Pt denies new neurological symptoms such as new headache, or facial or extremity weakness or numbness   Pt denies polydipsia, polyuria Past Medical History  Diagnosis Date  . Hypercholesteremia   . Umbilical hernia   . Hx of colonic polyps   . Benign prostatic hypertrophy   . DDD (degenerative disc disease)   . Panic attack    Past Surgical History  Procedure Laterality Date  . Knee arthroscopy      reports that he has never smoked. He has never used smokeless tobacco. He reports that he drinks alcohol. He reports that he does not use illicit drugs. family history includes Heart disease in his brother, father, and mother. Allergies  Allergen Reactions  . Niacin     REACTION: intol w/ flushing   Current Outpatient Prescriptions on File Prior to Visit  Medication Sig Dispense Refill  . ALPRAZolam (XANAX) 0.5 MG tablet Take 1 tablet (0.5 mg total) by mouth every 6 (six) hours as needed. for anxiety 30 tablet 2  . aspirin 81 MG tablet Take 81 mg by mouth daily.    Marland Kitchen esomeprazole (NEXIUM) 20 MG capsule Take 20 mg by mouth daily at 12 noon.    . fluticasone (FLONASE) 50 MCG/ACT nasal spray Place 1-2 sprays into both nostrils 2 (two) times daily. 48 g 1  . Multiple Vitamins-Minerals (MULTIVITAMIN WITH MINERALS) tablet Take 1 tablet by mouth daily.     No current facility-administered medications on file prior to visit.   Review of Systems All otherwise neg per pt    Objective:   Physical Exam BP 122/78 mmHg  Pulse 72  Resp 20  Wt 204 lb  (92.534 kg)  SpO2 98% VS noted,  Constitutional: Pt appears in no apparent distress HENT: Head: NCAT.  Right Ear: External ear normal.  Left Ear: External ear normal.  Eyes: . Pupils are equal, round, and reactive to light. Conjunctivae and EOM are normal Neck: Normal range of motion. Neck supple.  Cardiovascular: Normal rate and regular rhythm.   Pulmonary/Chest: Effort normal and breath sounds without rales or wheezing.  Abd:  Soft, NT, ND, + BS Neurological: Pt is alert. Not confused , motor grossly intact Skin: Skin is warm., no LE edema, right arm anterior below the elbow with extensive itchy and mild tender numerous erythem lesions, slight weepy but no red streaks, or palpable LA Psychiatric: Pt behavior is normal. No agitation.     Assessment & Plan:

## 2015-12-22 NOTE — Progress Notes (Signed)
Pre visit review using our clinic review tool, if applicable. No additional management support is needed unless otherwise documented below in the visit note. 

## 2015-12-22 NOTE — Patient Instructions (Signed)
You had the steroid shot today  Please take all new medication as prescribed - the prednisone  Please continue all other medications as before, and refills have been done if requested.  Please have the pharmacy call with any other refills you may need  Please keep your appointments with your specialists as you may have planned     

## 2016-01-20 ENCOUNTER — Telehealth: Payer: Self-pay | Admitting: *Deleted

## 2016-01-20 ENCOUNTER — Encounter: Payer: Self-pay | Admitting: Internal Medicine

## 2016-01-20 ENCOUNTER — Ambulatory Visit (INDEPENDENT_AMBULATORY_CARE_PROVIDER_SITE_OTHER): Payer: Managed Care, Other (non HMO) | Admitting: Internal Medicine

## 2016-01-20 VITALS — BP 128/78 | HR 72 | Temp 97.8°F | Resp 12 | Wt 201.4 lb

## 2016-01-20 DIAGNOSIS — N401 Enlarged prostate with lower urinary tract symptoms: Secondary | ICD-10-CM | POA: Diagnosis not present

## 2016-01-20 DIAGNOSIS — N138 Other obstructive and reflux uropathy: Secondary | ICD-10-CM

## 2016-01-20 DIAGNOSIS — N529 Male erectile dysfunction, unspecified: Secondary | ICD-10-CM

## 2016-01-20 MED ORDER — TADALAFIL 5 MG PO TABS: 5.0000 mg | ORAL_TABLET | Freq: Every day | ORAL | Status: DC

## 2016-01-20 NOTE — Assessment & Plan Note (Signed)
Not on meds right now but has taken flomax in the past. Rx for cialis daily for his BPH symptoms.

## 2016-01-20 NOTE — Progress Notes (Signed)
   Subjective:    Patient ID: Francisco Price, male    DOB: [birth year], 65 y.o.   MRN: [id number]  HPI The patient is a 65 YO man coming in for his prostate. He had been told before that his prostate was enlarged. He was on medicine for it at one time. He is now having more symptoms with hesitancy and poor stream. He denies any burning, genital lesions, sores. Also having some ED symptoms. About 5-6 times in the last month with that. Would like something to treat both if possible. Thinks he has taken flomax in the past.   Review of Systems  Constitutional: Negative for fever, activity change, appetite change, fatigue and unexpected weight change.  Respiratory: Negative for cough, chest tightness, shortness of breath and wheezing.   Cardiovascular: Negative for chest pain, palpitations and leg swelling.  Gastrointestinal: Negative for nausea, abdominal pain, diarrhea, constipation and abdominal distention.  Genitourinary:       Weak stream, ED, hesitancy  Musculoskeletal: Negative.   Skin: Negative.       Objective:   Physical Exam  Constitutional: He is oriented to person, place, and time. He appears well-developed and well-nourished.  HENT:  Head: Normocephalic and atraumatic.  Eyes: EOM are normal.  Neck: Normal range of motion.  Cardiovascular: Normal rate and regular rhythm.   Pulmonary/Chest: Effort normal and breath sounds normal. No respiratory distress. He has no wheezes. He has no rales.  Abdominal: Soft. Bowel sounds are normal. He exhibits no distension. There is no tenderness. There is no rebound.  Musculoskeletal: He exhibits no edema.  Neurological: He is alert and oriented to person, place, and time. Coordination normal.  Skin: Skin is warm and dry.  Psychiatric: He has a normal mood and affect.   Filed Vitals:   01/20/16 0828  BP: 128/78  Pulse: 72  Temp: 97.8 F (36.6 C)  TempSrc: Oral  Resp: 12  Weight: 201 lb 6.4 oz (91.354 kg)  SpO2: 97%        Assessment & Plan:

## 2016-01-20 NOTE — Telephone Encounter (Signed)
Left msg on triage stating insurance will not cover Cialis...Johny Chess

## 2016-01-20 NOTE — Telephone Encounter (Signed)
PA initiated via CoverMyMeds key [id number]

## 2016-01-20 NOTE — Telephone Encounter (Signed)
Do they require PA?

## 2016-01-20 NOTE — Progress Notes (Signed)
Pre visit review using our clinic review tool, if applicable. No additional management support is needed unless otherwise documented below in the visit note. 

## 2016-01-20 NOTE — Assessment & Plan Note (Signed)
Does not want specific treatment but hopefully his BPH treatment will help.

## 2016-01-20 NOTE — Telephone Encounter (Signed)
Called pt to verify no answer forwarding msg to [facility] to proceed with PA...Francisco Price

## 2016-01-20 NOTE — Patient Instructions (Signed)
We have sent in the cialis that you take daily to help with the prostate and the ED.   As we talked about if you have an erection lasting longer than 3-4 hours seek help immediately.   Benign Prostatic Hypertrophy The prostate gland is part of the reproductive system of men. A normal prostate is about the size and shape of a walnut. The prostate gland produces a fluid that is mixed with sperm to make semen. This gland surrounds the urethra and is located in front of the rectum and just below the bladder. The bladder is where urine is stored. The urethra is the tube through which urine passes from the bladder to get out of the body. The prostate grows as a man ages. An enlarged prostate not caused by cancer is called benign prostatic hypertrophy (BPH). An enlarged prostate can press on the urethra. This can make it harder to pass urine. In the early stages of enlargement, the bladder can get by with a narrowed urethra by forcing the urine through. If the problem gets worse, medical or surgical treatment may be required.  This condition should be followed by your health care provider. The accumulation of urine in the bladder can cause infection. Back pressure and infection can progress to bladder damage and kidney (renal) failure. If needed, your health care provider may refer you to a specialist in kidney and prostate disease (urologist). CAUSES  BPH is a common health problem in men older than 50 years. This condition is a normal part of aging. However, not all men will develop problems from this condition. If the enlargement grows away from the urethra, then there will not be any compression of the urethra and resistance to urine flow.If the growth is toward the urethra and compresses it, you will experience difficulty urinating.  SYMPTOMS   Not able to completely empty your bladder.  Getting up often during the night to urinate.  Need to urinate frequently during the day.  Difficultly starting  urine flow.  Decrease in size and strength of your urine stream.  Dribbling after urination.  Pain on urination (more common with infection).  Inability to pass urine. This needs immediate treatment.  The development of a urinary tract infection. DIAGNOSIS  These tests will help your health care provider understand your problem:  A thorough history and physical examination.  A urination history, with the number of times you urinate, the amounts of urine, the strength of the urine stream, and the feeling of emptiness or fullness after urinating.  A postvoid bladder scan that measures any amount of urine that may remain in your bladder after you finish urinating.  Digital rectal exam. In a rectal exam, your health care provider checks your prostate by putting a gloved, lubricated finger into your rectum to feel the back of your prostate gland. This exam detects the size of your gland and abnormal lumps or growths.  Exam of your urine (urinalysis).  Prostate specific antigen (PSA) screening. This is a blood test used to screen for prostate cancer.  Rectal ultrasonography. This test uses sound waves to electronically produce a picture of your prostate gland. TREATMENT  Once symptoms begin, your health care provider will monitor your condition. Of the men with this condition, one third will have symptoms that stabilize, one third will have symptoms that improve, and one third will have symptoms that progress in the first year. Mild symptoms may not need treatment. Simple observation and yearly exams may be all that  is required. Medicines and surgery are options for more severe problems. Your health care provider can help you make an informed decision for what is best. Two classes of medicines are available for relief of prostate symptoms:  Medicines that shrink the prostate. This helps relieve symptoms. These medicines take time to work, and it may be months before any improvement is  seen.  Uncommon side effects include problems with sexual function.  Medicines to relax the muscle of the prostate. This also relieves the obstruction by reducing any compression on the urethra.This group of medicines work much faster than those that reduce the size of the prostate gland. Usually, one can experience improvement in days to weeks..  Side effects can include dizziness, fatigue, lightheadedness, and retrograde ejaculation (diminished volume of ejaculate). Several types of surgical treatments are available for relief of prostate symptoms:  Transurethral resection of the prostate (TURP)--In this treatment, an instrument is inserted through opening at the tip of the penis. It is used to cut away pieces of the inner core of the prostate. The pieces are removed through the same opening of the penis. This removes the obstruction and helps get rid of the symptoms.  Transurethral incision (TUIP)--In this procedure, small cuts are made in the prostate. This lessens the prostates pressure on the urethra.  Transurethral microwave thermotherapy (TUMT)--This procedure uses microwaves to create heat. The heat destroys and removes a small amount of prostate tissue.  Transurethral needle ablation (TUNA)--This is a procedure that uses radio frequencies to do the same as TUMT.  Interstitial laser coagulation (ILC)--This is a procedure that uses a laser to do the same as TUMT and TUNA.  Transurethral electrovaporization (TUVP)--This is a procedure that uses electrodes to do the same as the procedures listed above. SEEK MEDICAL CARE IF:   You develop a fever.  There is unexplained back pain.  Symptoms are not helped by medicines prescribed.  You develop side effects from the medicine you are taking.  Your urine becomes very dark or has a bad smell.  Your lower abdomen becomes distended and you have difficulty passing your urine. SEEK IMMEDIATE MEDICAL CARE IF:   You are suddenly unable  to urinate. This is an emergency. You should be seen immediately.  There are large amounts of blood or clots in the urine.  Your urinary problems become unmanageable.  You develop lightheadedness, severe dizziness, or you feel faint.  You develop moderate to severe low back or flank pain.  You develop chills or fever.   This information is not intended to replace advice given to you by your health care provider. Make sure you discuss any questions you have with your health care provider.   Document Released: 07/11/2005 Document Revised: 07/16/2013 Document Reviewed: 01/24/2013 Elsevier Interactive Patient Education [employer].

## 2016-01-21 NOTE — Telephone Encounter (Signed)
Spouse advised via personal VM

## 2016-01-21 NOTE — Telephone Encounter (Signed)
APPROVED 01/20/2016 - 01/19/2017

## 2016-02-04 ENCOUNTER — Ambulatory Visit: Payer: Managed Care, Other (non HMO) | Admitting: Pulmonary Disease

## 2016-02-05 ENCOUNTER — Ambulatory Visit: Payer: Managed Care, Other (non HMO) | Admitting: Pulmonary Disease

## 2016-08-27 ENCOUNTER — Other Ambulatory Visit: Payer: Self-pay | Admitting: Internal Medicine

## 2016-10-03 ENCOUNTER — Encounter: Payer: Self-pay | Admitting: Internal Medicine

## 2016-10-03 ENCOUNTER — Ambulatory Visit (INDEPENDENT_AMBULATORY_CARE_PROVIDER_SITE_OTHER): Payer: Managed Care, Other (non HMO) | Admitting: Internal Medicine

## 2016-10-03 DIAGNOSIS — M25561 Pain in right knee: Secondary | ICD-10-CM

## 2016-10-03 DIAGNOSIS — G8929 Other chronic pain: Secondary | ICD-10-CM

## 2016-10-03 NOTE — Progress Notes (Signed)
   Subjective:    Patient ID: Francisco Price, male    DOB: Sep 16, 1950, 66 y.o.   MRN: [id number]  HPI The patient is a 66 YO man coming in for 1 year of knee pain. Right knee and mild overall. More sore with prolonged sitting. He is able to walk for a long time without pain. Has been using tylenol for the pain which is working well. Tried aleve and it was not helpful. He denies injury to the area or twisting injury. Playing bowling makes it hurt some but not if he takes tylenol beforehand. No fevers or chills.   Review of Systems  Constitutional: Negative for activity change, appetite change, fatigue, fever and unexpected weight change.  Respiratory: Negative.   Cardiovascular: Negative.   Musculoskeletal: Positive for arthralgias. Negative for back pain, gait problem, joint swelling, myalgias, neck pain and neck stiffness.  Skin: Negative.   Neurological: Negative.       Objective:   Physical Exam  Constitutional: He is oriented to person, place, and time. He appears well-developed and well-nourished.  HENT:  Head: Normocephalic and atraumatic.  Eyes: EOM are normal.  Neck: Normal range of motion.  Cardiovascular: Normal rate and regular rhythm.   Pulmonary/Chest: Effort normal and breath sounds normal. No respiratory distress. He has no wheezes. He has no rales.  Abdominal: Soft.  Musculoskeletal: He exhibits no tenderness.  Right knee without effusion or tenderness to palpation anywhere.   Neurological: He is alert and oriented to person, place, and time.  Skin: Skin is warm and dry.   Vitals:   10/03/16 0929  BP: 140/76  Pulse: 79  SpO2: 99%  Weight: 209 lb (94.8 kg)  Height: 6\' 2"  (1.88 m)      Assessment & Plan:

## 2016-10-03 NOTE — Progress Notes (Signed)
Pre visit review using our clinic review tool, if applicable. No additional management support is needed unless otherwise documented below in the visit note. 

## 2016-10-03 NOTE — Assessment & Plan Note (Signed)
Advised that tylenol is safe to take. No indication for imaging today. Chronic problem which is unchanged. Can also try ibuprofen if needed. If worsening can evaluate with x-ray for arthritis. Talked about increasing exercise to help.

## 2016-10-03 NOTE — Patient Instructions (Signed)
You can try ibuprofen to try for pain and the tylenol is safe to take if needed.    Knee Exercises Ask your health care provider which exercises are safe for you. Do exercises exactly as told by your health care provider and adjust them as directed. It is normal to feel mild stretching, pulling, tightness, or discomfort as you do these exercises, but you should stop right away if you feel sudden pain or your pain gets worse.Do not begin these exercises until told by your health care provider. STRETCHING AND RANGE OF MOTION EXERCISES  These exercises warm up your muscles and joints and improve the movement and flexibility of your knee. These exercises also help to relieve pain, numbness, and tingling. Exercise A: Knee Extension, Prone  1. Lie on your abdomen on a bed. 2. Place your left / right knee just beyond the edge of the surface so your knee is not on the bed. You can put a towel under your left / right thigh just above your knee for comfort. 3. Relax your leg muscles and allow gravity to straighten your knee. You should feel a stretch behind your left / right knee. 4. Hold this position for __________ seconds. 5. Scoot up so your knee is supported between repetitions. Repeat __________ times. Complete this stretch __________ times a day. Exercise B: Knee Flexion, Active   1. Lie on your back with both knees straight. If this causes back discomfort, bend your left / right knee so your foot is flat on the floor. 2. Slowly slide your left / right heel back toward your buttocks until you feel a gentle stretch in the front of your knee or thigh. 3. Hold this position for __________ seconds. 4. Slowly slide your left / right heel back to the starting position. Repeat __________ times. Complete this exercise __________ times a day. Exercise C: Quadriceps, Prone   1. Lie on your abdomen on a firm surface, such as a bed or padded floor. 2. Bend your left / right knee and hold your ankle. If you  cannot reach your ankle or pant leg, loop a belt around your foot and grab the belt instead. 3. Gently pull your heel toward your buttocks. Your knee should not slide out to the side. You should feel a stretch in the front of your thigh and knee. 4. Hold this position for __________ seconds. Repeat __________ times. Complete this stretch __________ times a day. Exercise D: Hamstring, Supine  1. Lie on your back. 2. Loop a belt or towel over the ball of your left / right foot. The ball of your foot is on the walking surface, right under your toes. 3. Straighten your left / right knee and slowly pull on the belt to raise your leg until you feel a gentle stretch behind your knee.  Do not let your left / right knee bend while you do this.  Keep your other leg flat on the floor. 4. Hold this position for __________ seconds. Repeat __________ times. Complete this stretch __________ times a day. STRENGTHENING EXERCISES  These exercises build strength and endurance in your knee. Endurance is the ability to use your muscles for a long time, even after they get tired. Exercise E: Quadriceps, Isometric   1. Lie on your back with your left / right leg extended and your other knee bent. Put a rolled towel or small pillow under your knee if told by your health care provider. 2. Slowly tense the muscles in  the front of your left / right thigh. You should see your kneecap slide up toward your hip or see increased dimpling just above the knee. This motion will push the back of the knee toward the floor. 3. For __________ seconds, keep the muscle as tight as you can without increasing your pain. 4. Relax the muscles slowly and completely. Repeat __________ times. Complete this exercise __________ times a day. Exercise F: Straight Leg Raises - Quadriceps  1. Lie on your back with your left / right leg extended and your other knee bent. 2. Tense the muscles in the front of your left / right thigh. You should  see your kneecap slide up or see increased dimpling just above the knee. Your thigh may even shake a bit. 3. Keep these muscles tight as you raise your leg 4-6 inches (10-15 cm) off the floor. Do not let your knee bend. 4. Hold this position for __________ seconds. 5. Keep these muscles tense as you lower your leg. 6. Relax your muscles slowly and completely after each repetition. Repeat __________ times. Complete this exercise __________ times a day. Exercise G: Hamstring, Isometric  1. Lie on your back on a firm surface. 2. Bend your left / right knee approximately __________ degrees. 3. Dig your left / right heel into the surface as if you are trying to pull it toward your buttocks. Tighten the muscles in the back of your thighs to dig as hard as you can without increasing any pain. 4. Hold this position for __________ seconds. 5. Release the tension gradually and allow your muscles to relax completely for __________ seconds after each repetition. Repeat __________ times. Complete this exercise __________ times a day. Exercise H: Hamstring Curls   If told by your health care provider, do this exercise while wearing ankle weights. Begin with __________ weights. Then increase the weight by 1 lb (0.5 kg) increments. Do not wear ankle weights that are more than __________. 1. Lie on your abdomen with your legs straight. 2. Bend your left / right knee as far as you can without feeling pain. Keep your hips flat against the floor. 3. Hold this position for __________ seconds. 4. Slowly lower your leg to the starting position. Repeat __________ times. Complete this exercise __________ times a day. Exercise I: Squats (Quadriceps)  1. Stand in front of a table, with your feet and knees pointing straight ahead. You may rest your hands on the table for balance but not for support. 2. Slowly bend your knees and lower your hips like you are going to sit in a chair.  Keep your weight over your heels,  not over your toes.  Keep your lower legs upright so they are parallel with the table legs.  Do not let your hips go lower than your knees.  Do not bend lower than told by your health care provider.  If your knee pain increases, do not bend as low. 3. Hold the squat position for __________ seconds. 4. Slowly push with your legs to return to standing. Do not use your hands to pull yourself to standing. Repeat __________ times. Complete this exercise __________ times a day. Exercise J: Wall Slides (Quadriceps)   1. Lean your back against a smooth wall or door while you walk your feet out 18-24 inches (46-61 cm) from it. 2. Place your feet hip-width apart. 3. Slowly slide down the wall or door until your knees Repeat __________ times. Complete this exercise __________ times a day. 4.  Exercise K: Straight Leg Raises - Hip Abductors  1. Lie on your side with your left / right leg in the top position. Lie so your head, shoulder, knee, and hip line up. You may bend your bottom knee to help you keep your balance. 2. Roll your hips slightly forward so your hips are stacked directly over each other and your left / right knee is facing forward. 3. Leading with your heel, lift your top leg 4-6 inches (10-15 cm). You should feel the muscles in your outer hip lifting.  Do not let your foot drift forward.  Do not let your knee roll toward the ceiling. 4. Hold this position for __________ seconds. 5. Slowly return your leg to the starting position. 6. Let your muscles relax completely after each repetition. Repeat __________ times. Complete this exercise __________ times a day. Exercise L: Straight Leg Raises - Hip Extensors  1. Lie on your abdomen on a firm surface. You can put a pillow under your hips if that is more comfortable. 2. Tense the muscles in your buttocks and lift your left / right leg about 4-6 inches (10-15 cm). Keep your knee straight as you lift your leg. 3. Hold this position for  __________ seconds. 4. Slowly lower your leg to the starting position. 5. Let your leg relax completely after each repetition. Repeat __________ times. Complete this exercise __________ times a day. This information is not intended to replace advice given to you by your health care provider. Make sure you discuss any questions you have with your health care provider. Document Released: 05/25/2005 Document Revised: 04/04/2016 Document Reviewed: 05/17/2015 Elsevier Interactive Patient Education  2017 [employer].

## 2017-02-09 ENCOUNTER — Ambulatory Visit (INDEPENDENT_AMBULATORY_CARE_PROVIDER_SITE_OTHER): Payer: Managed Care, Other (non HMO) | Admitting: Nurse Practitioner

## 2017-02-09 ENCOUNTER — Encounter: Payer: Self-pay | Admitting: Nurse Practitioner

## 2017-02-09 VITALS — BP 110/68 | HR 68 | Temp 97.8°F | Ht 74.0 in | Wt 202.0 lb

## 2017-02-09 DIAGNOSIS — L719 Rosacea, unspecified: Secondary | ICD-10-CM

## 2017-02-09 MED ORDER — METRONIDAZOLE 0.75 % EX GEL: 1.0000 "application " | Freq: Two times a day (BID) | CUTANEOUS | 0 refills | Status: DC

## 2017-02-09 NOTE — Progress Notes (Signed)
Subjective:  Patient ID: Francisco Price, male    DOB: 17-Jun-1951  Age: 66 y.o. MRN: [id number]  CC: Rash (red spot on nose,skin gets dry--going on for a while. )   Rash  This is a recurrent problem. The current episode started more than 1 month ago. The problem has been waxing and waning since onset. Location: nose. The rash is characterized by redness and scaling. He was exposed to nothing. Pertinent negatives include no anorexia, eye pain, fever, joint pain, nail changes or sore throat. Past treatments include nothing.  retired at this time. Use to work outside for living. Currently does yardwork maybe once a week. Does not wear sunscreen. No FHx of skin cancer. No hx of scaly eyebrows or scalp.  Outpatient Medications Prior to Visit  Medication Sig Dispense Refill  . ALPRAZolam (XANAX) 0.5 MG tablet Take 1 tablet (0.5 mg total) by mouth every 6 (six) hours as needed. for anxiety 30 tablet 2  . aspirin 81 MG tablet Take 81 mg by mouth daily.    Marland Kitchen CIALIS 5 MG tablet TAKE 1 TABLET BY MOUTH DAILY 30 tablet 6  . esomeprazole (NEXIUM) 20 MG capsule Take 20 mg by mouth daily at 12 noon.    . Multiple Vitamins-Minerals (MULTIVITAMIN WITH MINERALS) tablet Take 1 tablet by mouth daily.    . fluticasone (FLONASE) 50 MCG/ACT nasal spray Place 1-2 sprays into both nostrils 2 (two) times daily. (Patient not taking: Reported on 02/09/2017) 48 g 1   No facility-administered medications prior to visit.     ROS See HPI  Objective:  BP 110/68   Pulse 68   Temp 97.8 F (36.6 C)   Ht 6\' 2"  (1.88 m)   Wt 202 lb (91.6 kg)   SpO2 99%   BMI 25.94 kg/m   BP Readings from Last 3 Encounters:  02/09/17 110/68  10/03/16 140/76  01/20/16 128/78    Wt Readings from Last 3 Encounters:  02/09/17 202 lb (91.6 kg)  10/03/16 209 lb (94.8 kg)  01/20/16 201 lb 6.4 oz (91.4 kg)    Physical Exam  Constitutional: He is oriented to person, place, and time.  HENT:  Right Ear: External ear normal.    Left Ear: External ear normal.  Nose:    Eyes: Pupils are equal, round, and reactive to light. Conjunctivae and EOM are normal. No scleral icterus.  Musculoskeletal: He exhibits no edema.  Neurological: He is alert and oriented to person, place, and time.  Skin: Skin is warm and dry. There is erythema.    Lab Results  Component Value Date   WBC 5.8 02/05/2015   HGB 17.4 (H) 02/05/2015   HCT 50.7 02/05/2015   PLT 186.0 02/05/2015   GLUCOSE 97 02/05/2015   CHOL 171 02/05/2015   TRIG 127.0 02/05/2015   HDL 35.90 (L) 02/05/2015   LDLCALC 110 (H) 02/05/2015   ALT 20 02/05/2015   AST 19 02/05/2015   NA 140 02/05/2015   K 4.9 02/05/2015   CL 104 02/05/2015   CREATININE 1.37 02/05/2015   BUN 19 02/05/2015   CO2 28 02/05/2015   TSH 2.72 02/05/2015   PSA 1.60 02/05/2015   HGBA1C 5.2 10/09/2008    Dg Chest 2 View  Result Date: 02/05/2015 CLINICAL DATA:  Annual physical exam EXAM: CHEST  2 VIEW COMPARISON:  11/20/2012 FINDINGS: The heart size and mediastinal contours are within normal limits. Both lungs are clear. Stable mild degenerative changes thoracic spine. IMPRESSION: No active cardiopulmonary disease.  Electronically Signed   By: Lahoma Crocker M.D.   On: 02/05/2015 11:17    Assessment & Plan:   Demonta was seen today for rash.  Diagnoses and all orders for this visit:  Rosacea -     metroNIDAZOLE (METROGEL) 0.75 % gel; Apply 1 application topically 2 (two) times daily.   I am having Mr. Parodi start on metroNIDAZOLE. I am also having him maintain his aspirin, fluticasone, esomeprazole, multivitamin with minerals, ALPRAZolam, and CIALIS.  Meds ordered this encounter  Medications  . metroNIDAZOLE (METROGEL) 0.75 % gel    Sig: Apply 1 application topically 2 (two) times daily.    Dispense:  45 g    Refill:  0    Order Specific Question:   Supervising Provider    Answer:   Cassandria Anger [[id number]]    Follow-up: Return if symptoms worsen or fail to  improve.  Wilfred Lacy, NP

## 2017-02-09 NOTE — Patient Instructions (Signed)
Call office for referral to dermatology if no improvement in 1week.   Rosacea Rosacea is a long-term (chronic) condition that affects the skin of the face, including the cheeks, nose, brow, and chin. This condition can also affect the eyes. Rosacea causes blood vessels near the surface of the skin to get bigger (be enlarged), and that makes the skin red. There is no cure for this condition, but treatment can help to control your symptoms. Follow these instructions at home: Skin Care Take care of your skin as told by your doctor. Your doctor may tell you do these things:  Wash your skin gently two or more times each day.  Use mild soap.  Use a sunscreen or sunblock with SPF 30 or greater.  Use gentle cosmetics that are meant for sensitive skin.  Shave with an electric shaver instead of a blade.  Lifestyle  Try to keep track of what foods trigger this condition. Avoid any triggers. These may include: ? Spicy foods. ? Seafood. ? Cheese. ? Hot liquids. ? Nuts. ? Chocolate. ? Iodized salt.  Do not drink alcohol.  Avoid extremely cold or hot temperatures.  Try to reduce your stress. If you need help to do this, talk with your doctor.  When you exercise, do these things to stay cool: ? Limit your sun exposure. ? Use a fan. ? Exercise for a shorter time, and exercise more often. General instructions  Keep all follow-up visits as told by your doctor. This is important.  Take over-the-counter and prescription medicines only as told by your doctor.  If your eyelids are affected, press warm compresses to them. Do this as told by your doctor.  If you were prescribed an antibiotic medicine, apply or take it as told by your doctor. Do not stop using the antibiotic even if your condition improves. Contact a doctor if:  Your symptoms get worse.  Your symptoms do not improve after two months of treatment.  You have new symptoms.  You have any changes in vision or you have  problems with your eyes, such as redness or itching.  You feel very sad (depressed).  You lose your appetite.  You have trouble concentrating. This information is not intended to replace advice given to you by your health care provider. Make sure you discuss any questions you have with your health care provider. Document Released: 10/03/2011 Document Revised: 12/17/2015 Document Reviewed: 09/17/2014 Elsevier Interactive Patient Education  [employer].

## 2017-02-20 ENCOUNTER — Telehealth: Payer: Self-pay | Admitting: Internal Medicine

## 2017-02-20 DIAGNOSIS — L719 Rosacea, unspecified: Secondary | ICD-10-CM

## 2017-02-20 DIAGNOSIS — J3489 Other specified disorders of nose and nasal sinuses: Secondary | ICD-10-CM

## 2017-02-20 NOTE — Telephone Encounter (Signed)
Wife called in and said that treatment that was given is not working.  She said that Francisco Price told her if it did not work then she would refer him to a dermatologist

## 2017-02-20 NOTE — Telephone Encounter (Signed)
Referral entered  

## 2017-02-20 NOTE — Telephone Encounter (Signed)
Please advise 

## 2017-02-20 NOTE — Telephone Encounter (Signed)
Ok to enter referral to dermatologist

## 2017-03-13 ENCOUNTER — Telehealth: Payer: Self-pay | Admitting: Internal Medicine

## 2017-03-13 NOTE — Telephone Encounter (Signed)
PA started on CoverMyMeds KEY: [id number]

## 2017-03-13 NOTE — Telephone Encounter (Signed)
Pt needs a PA on CIALIS 5 MG tablet

## 2017-03-16 NOTE — Telephone Encounter (Addendum)
PA denied, contacted patient. Patient is having wife [name] call back she is on his DPR. I just wanted to let them know that [facility] in [place] offers cheaper ED medication and they also offer delivery. All they have to do is call [phone]

## 2017-03-21 ENCOUNTER — Telehealth: Payer: Self-pay

## 2017-03-21 NOTE — Telephone Encounter (Signed)
Appeal letter for Cialis faxed

## 2017-03-22 ENCOUNTER — Encounter: Payer: Self-pay | Admitting: Nurse Practitioner

## 2017-03-31 NOTE — Telephone Encounter (Signed)
Pt called regarding this I informed him we are waiting to here about the appeal.

## 2017-04-05 NOTE — Telephone Encounter (Signed)
Called and left a voicemail for patients wife about the appeal for cialis. They are still denying the medication even after the appeal. I was not sure if they wanted to make an appointment to talk to Dr. Sharlet Salina about alternatives options so I told them to call back.

## 2017-04-17 NOTE — Telephone Encounter (Signed)
Spouse has called back in.  Would like to know if Dr. Sharlet Salina could send a script for sildenafil to [facility] in [place] (ph [phone]).  Script would need to be 25 mg tabs with a qty of 75.

## 2017-04-17 NOTE — Telephone Encounter (Signed)
The sildenafil would not be an alternative to cialis for the prostate enlargement. It would only help with erectile dysfunction and would not be covered by insurance either. Do they still want?

## 2017-04-18 MED ORDER — SILDENAFIL CITRATE 20 MG PO TABS: 20.0000 mg | ORAL_TABLET | Freq: Three times a day (TID) | ORAL | 0 refills | Status: DC

## 2017-04-18 NOTE — Telephone Encounter (Signed)
Patients wife notified Rx was faxed

## 2017-04-18 NOTE — Telephone Encounter (Signed)
Lysbeth Galas from [facility] [phone] Pharmacy called regarding the patients sildenafil (REVATIO) 20 MG tablet  They would like to know if this is daily or only as needed. Please advise and call back in regard

## 2017-04-18 NOTE — Telephone Encounter (Signed)
Have printed as cannot find pharmacy. They can pick up or provide fax number for pharmacy.

## 2017-04-18 NOTE — Addendum Note (Signed)
Addended by: Pricilla Holm A on: 04/18/2017 12:51 PM   Modules accepted: Orders

## 2017-04-18 NOTE — Telephone Encounter (Signed)
Contacted patients wife they still would like the Sildenafil 20mg  tablets with a quantity of 75. They can get it for about $50 through that Drug store.

## 2017-04-19 NOTE — Telephone Encounter (Signed)
Pharmacy contacted and notified medication is as needed

## 2017-09-26 ENCOUNTER — Ambulatory Visit: Payer: Managed Care, Other (non HMO) | Admitting: Internal Medicine

## 2017-09-26 ENCOUNTER — Other Ambulatory Visit (INDEPENDENT_AMBULATORY_CARE_PROVIDER_SITE_OTHER): Payer: Managed Care, Other (non HMO)

## 2017-09-26 ENCOUNTER — Encounter: Payer: Self-pay | Admitting: Internal Medicine

## 2017-09-26 VITALS — BP 142/100 | HR 75 | Temp 98.0°F | Ht 74.0 in | Wt 208.0 lb

## 2017-09-26 DIAGNOSIS — N401 Enlarged prostate with lower urinary tract symptoms: Secondary | ICD-10-CM

## 2017-09-26 DIAGNOSIS — N138 Other obstructive and reflux uropathy: Secondary | ICD-10-CM

## 2017-09-26 LAB — COMPREHENSIVE METABOLIC PANEL
ALBUMIN: 4.4 g/dL (ref 3.5–5.2)
ALK PHOS: 65 U/L (ref 39–117)
ALT: 16 U/L (ref 0–53)
AST: 17 U/L (ref 0–37)
BUN: 17 mg/dL (ref 6–23)
CALCIUM: 9.8 mg/dL (ref 8.4–10.5)
CO2: 28 mEq/L (ref 19–32)
Chloride: 105 mEq/L (ref 96–112)
Creatinine, Ser: 1.19 mg/dL (ref 0.40–1.50)
GFR: 64.74 mL/min (ref >60.00)
GLUCOSE: 101 mg/dL — AB (ref 70–99)
POTASSIUM: 4.2 meq/L (ref 3.5–5.1)
Sodium: 140 mEq/L (ref 135–145)
TOTAL PROTEIN: 6.8 g/dL (ref 6.0–8.3)
Total Bilirubin: 0.6 mg/dL (ref 0.2–1.2)

## 2017-09-26 LAB — CBC
HEMATOCRIT: 47.7 % (ref 39.0–52.0)
HEMOGLOBIN: 16.9 g/dL (ref 13.0–17.0)
MCHC: 35.3 g/dL (ref 30.0–36.0)
MCV: 89.7 fl (ref 78.0–100.0)
Platelets: 184 10*3/uL (ref 150.0–400.0)
RBC: 5.32 Mil/uL (ref 4.22–5.81)
RDW: 12.6 % (ref 11.5–15.5)
WBC: 5.2 10*3/uL (ref 4.0–10.5)

## 2017-09-26 LAB — LIPID PANEL
CHOLESTEROL: 169 mg/dL (ref 0–200)
HDL: 30.4 mg/dL — AB (ref >39.00)
NonHDL: 138.56
TRIGLYCERIDES: 302 mg/dL — AB (ref 0.0–149.0)
Total CHOL/HDL Ratio: 6
VLDL: 60.4 mg/dL — AB (ref 0.0–40.0)

## 2017-09-26 LAB — PSA: PSA: 1.26 ng/mL (ref 0.10–4.00)

## 2017-09-26 LAB — LDL CHOLESTEROL, DIRECT: Direct LDL: 121 mg/dL

## 2017-09-26 NOTE — Patient Instructions (Signed)
We have sent in the flomax to take in the evening for the urinary stream.   We have refilled the cialis to take as well.

## 2017-09-26 NOTE — Progress Notes (Signed)
   Subjective:    Patient ID: Francisco Price, male    DOB: [birth year], 67 y.o.   MRN: [id number]  HPI The patient is a 67 YO man coming in for prostate problems causing weak stream. Has had prostate enlargement for many years and has been on several medicines in the past. Has tried daily cialis for some time but this did not help symptoms enough. He had mild improvement in ED and mild improvement in symptoms. Has frequent urination as well. Denies pain with urination or discharge or genital lesions.   Review of Systems  Constitutional: Negative.   Respiratory: Negative for cough, chest tightness and shortness of breath.   Cardiovascular: Negative for chest pain, palpitations and leg swelling.  Gastrointestinal: Negative for abdominal distention, abdominal pain, constipation, diarrhea, nausea and vomiting.  Genitourinary: Positive for decreased urine volume, difficulty urinating, enuresis and frequency. Negative for discharge, dysuria, flank pain, penile pain, penile swelling, scrotal swelling and testicular pain.  Musculoskeletal: Negative.   Skin: Negative.   Neurological: Negative.       Objective:   Physical Exam  Constitutional: He is oriented to person, place, and time. He appears well-developed and well-nourished.  HENT:  Head: Normocephalic and atraumatic.  Eyes: EOM are normal.  Neck: Normal range of motion.  Cardiovascular: Normal rate and regular rhythm.  Pulmonary/Chest: Effort normal and breath sounds normal. No respiratory distress. He has no wheezes. He has no rales.  Abdominal: Soft. He exhibits no distension. There is no tenderness. There is no rebound.  Genitourinary:  Genitourinary Comments: Declines rectal exam  Musculoskeletal: He exhibits no edema.  Neurological: He is alert and oriented to person, place, and time. Coordination normal.  Skin: Skin is warm and dry.   Vitals:   09/26/17 1512  BP: (!) 142/100  Pulse: 75  Temp: 98 F (36.7 C)  TempSrc: Oral   SpO2: 97%  Weight: 208 lb (94.3 kg)  Height: 6\' 2"  (1.88 m)      Assessment & Plan:

## 2017-09-27 ENCOUNTER — Telehealth: Payer: Self-pay | Admitting: Internal Medicine

## 2017-09-27 MED ORDER — TADALAFIL 5 MG PO TABS: 5.0000 mg | ORAL_TABLET | Freq: Every day | ORAL | 6 refills | Status: DC

## 2017-09-27 MED ORDER — TAMSULOSIN HCL 0.4 MG PO CAPS: 0.4000 mg | ORAL_CAPSULE | Freq: Every day | ORAL | 1 refills | Status: DC

## 2017-09-27 NOTE — Telephone Encounter (Signed)
Copied from [place] [id number]. Topic: Quick Communication - Rx Refill/Question >> Sep 27, 2017 10:21 AM Lolita Rieger, RMA wrote: Medication: cialis 5 mg   Has the patient contacted their pharmacy?yes   (Agent: If no, request that the patient contact the pharmacy for the refill.)   Preferred Pharmacy (with phone number or street name): [facility]   Agent: Please be advised that RX refills may take up to 3 business days. We ask that you follow-up with your pharmacy.

## 2017-09-27 NOTE — Assessment & Plan Note (Signed)
Continue cialis and add flomax. See back in 1-2 months to check on symptoms.

## 2017-10-30 ENCOUNTER — Telehealth: Payer: Self-pay | Admitting: Internal Medicine

## 2017-10-30 DIAGNOSIS — N529 Male erectile dysfunction, unspecified: Secondary | ICD-10-CM

## 2017-10-30 NOTE — Telephone Encounter (Signed)
Copied from [place] [id number]. Topic: Inquiry >> Oct 30, 2017  1:08 PM Oliver Pila B wrote: Reason for CRM: pt called and asked for a lab order for Testosterone, call pt to advise

## 2017-10-30 NOTE — Telephone Encounter (Signed)
States it is not because he is feeling bad it has to do with not being able to perform and or get an erection. Wants to know if he can get the labs or if he should still make an appointment

## 2017-10-30 NOTE — Telephone Encounter (Signed)
For a particular reason? If he is feeling bad should have visit.

## 2017-11-01 NOTE — Telephone Encounter (Signed)
Informed patient of MD response. patient stated understanding

## 2017-11-01 NOTE — Telephone Encounter (Signed)
Will place labs but needs to come before 9AM to get labs done. Also low testosterone cannot be diagnosed with 1 blood test but would need to be repeated at least 4 weeks later so he should make visit for visit in about 1 month if levels come back low.

## 2017-11-06 ENCOUNTER — Other Ambulatory Visit: Payer: Managed Care, Other (non HMO)

## 2017-11-06 DIAGNOSIS — N529 Male erectile dysfunction, unspecified: Secondary | ICD-10-CM

## 2017-11-06 NOTE — Addendum Note (Signed)
Addended by: Kathlyn [place] on: 11/06/2017 08:20 AM   Modules accepted: Orders

## 2017-11-08 LAB — TESTOSTERONE, FREE, TOTAL, SHBG
SEX HORMONE BINDING: 51.6 nmol/L (ref 19.3–76.4)
TESTOSTERONE FREE: 6.3 pg/mL — AB (ref 6.6–18.1)
TESTOSTERONE: 481 ng/dL (ref 264–916)

## 2018-04-18 ENCOUNTER — Other Ambulatory Visit: Payer: Self-pay | Admitting: Internal Medicine

## 2018-08-21 ENCOUNTER — Other Ambulatory Visit: Payer: Self-pay | Admitting: Internal Medicine

## 2018-08-21 MED ORDER — TADALAFIL 5 MG PO TABS: 5.0000 mg | ORAL_TABLET | Freq: Every day | ORAL | 6 refills | Status: DC

## 2018-08-21 NOTE — Telephone Encounter (Signed)
Requested medication (s) are due for refill today:  Yes   Requested medication (s) are on the active medication list:  yes  Future visit scheduled:  no  Last Refill: 04/19/18; #30; Refills x 6 **Last BP 142/100 at office visit 09/26/17  Call placed to pt.; left vm. To call and schedule f/u appt. (this was recommended for 1-2 mo. F/u re: urinary symptoms)   Requested Prescriptions  Pending Prescriptions Disp Refills   tadalafil (CIALIS) 5 MG tablet 30 tablet 6    Sig: Take 1 tablet (5 mg total) by mouth daily.     Urology: Erectile Dysfunction Agents Failed - 08/21/2018  9:01 AM      Failed - Last BP in normal range    BP Readings from Last 1 Encounters:  09/26/17 (!) 142/100         Passed - Valid encounter within last 12 months    Recent Outpatient Visits          10 months ago Benign prostatic hyperplasia with urinary obstruction   [facility], Elizabeth A, MD   1 year ago [place], Charlene Brooke, NP   1 year ago Chronic pain of right knee   [place], Elizabeth A, MD   2 years ago BPH (benign prostatic hypertrophy) with urinary obstruction   [facility] -Chuck Hint, MD   2 years ago Contact dermatitis   [facility] -Georges Mouse, MD

## 2018-08-21 NOTE — Telephone Encounter (Signed)
Copied from [place] [id number]. Topic: Quick Communication - Rx Refill/Question >> Aug 21, 2018  8:50 AM Leward Quan A wrote:  Medication: tadalafil (CIALIS) 5 MG tablet    Has the patient contacted their pharmacy? Yes.   (Agent: If no, request that the patient contact the pharmacy for the refill.) (Agent: If yes, when and what did the pharmacy advise?)  Preferred Pharmacy (with phone number or street name): [facility] [address] [place] - [place] [phone] (Phone) [phone] (Fax)    Agent: Please be advised that RX refills may take up to 3 business days. We ask that you follow-up with your pharmacy.

## 2018-10-01 ENCOUNTER — Ambulatory Visit (INDEPENDENT_AMBULATORY_CARE_PROVIDER_SITE_OTHER): Payer: Managed Care, Other (non HMO) | Admitting: Internal Medicine

## 2018-10-01 ENCOUNTER — Encounter: Payer: Self-pay | Admitting: Internal Medicine

## 2018-10-01 VITALS — BP 138/70 | HR 74 | Temp 98.0°F | Ht 74.0 in | Wt 209.0 lb

## 2018-10-01 DIAGNOSIS — Z0000 Encounter for general adult medical examination without abnormal findings: Secondary | ICD-10-CM | POA: Diagnosis not present

## 2018-10-01 DIAGNOSIS — N401 Enlarged prostate with lower urinary tract symptoms: Secondary | ICD-10-CM | POA: Diagnosis not present

## 2018-10-01 DIAGNOSIS — Z23 Encounter for immunization: Secondary | ICD-10-CM | POA: Diagnosis not present

## 2018-10-01 DIAGNOSIS — N138 Other obstructive and reflux uropathy: Secondary | ICD-10-CM

## 2018-10-01 NOTE — Assessment & Plan Note (Signed)
Checking PSA and still taking flomax with good results and cialis.

## 2018-10-01 NOTE — Assessment & Plan Note (Signed)
Flu shot given. Pneumonia 13 given to start series. Shingrix counseled. Tetanus counseled. Colonoscopy counseled and he declines, prior polyps so not candidate for cologuard. Counseled about sun safety and mole surveillance. Counseled about the dangers of distracted driving. Given 10 year screening recommendations.

## 2018-10-01 NOTE — Patient Instructions (Signed)
Health Maintenance After Age 69 After age 69, you are at a higher risk for certain long-term diseases and infections as well as injuries from falls. Falls are a major cause of broken bones and head injuries in people who are older than age 69. Getting regular preventive care can help to keep you healthy and well. Preventive care includes getting regular testing and making lifestyle changes as recommended by your health care provider. Talk with your health care provider about:  Which screenings and tests you should have. A screening is a test that checks for a disease when you have no symptoms.  A diet and exercise plan that is right for you. What should I know about screenings and tests to prevent falls? Screening and testing are the best ways to find a health problem early. Early diagnosis and treatment give you the best chance of managing medical conditions that are common after age 69. Certain conditions and lifestyle choices may make you more likely to have a fall. Your health care provider may recommend:  Regular vision checks. Poor vision and conditions such as cataracts can make you more likely to have a fall. If you wear glasses, make sure to get your prescription updated if your vision changes.  Medicine review. Work with your health care provider to regularly review all of the medicines you are taking, including over-the-counter medicines. Ask your health care provider about any side effects that may make you more likely to have a fall. Tell your health care provider if any medicines that you take make you feel dizzy or sleepy.  Osteoporosis screening. Osteoporosis is a condition that causes the bones to get weaker. This can make the bones weak and cause them to break more easily.  Blood pressure screening. Blood pressure changes and medicines to control blood pressure can make you feel dizzy.  Strength and balance checks. Your health care provider may recommend certain tests to check your  strength and balance while standing, walking, or changing positions.  Foot health exam. Foot pain and numbness, as well as not wearing proper footwear, can make you more likely to have a fall.  Depression screening. You may be more likely to have a fall if you have a fear of falling, feel emotionally low, or feel unable to do activities that you used to do.  Alcohol use screening. Using too much alcohol can affect your balance and may make you more likely to have a fall. What actions can I take to lower my risk of falls? General instructions  Talk with your health care provider about your risks for falling. Tell your health care provider if: ? You fall. Be sure to tell your health care provider about all falls, even ones that seem minor. ? You feel dizzy, sleepy, or off-balance.  Take over-the-counter and prescription medicines only as told by your health care provider. These include any supplements.  Eat a healthy diet and maintain a healthy weight. A healthy diet includes low-fat dairy products, low-fat (lean) meats, and fiber from whole grains, beans, and lots of fruits and vegetables. Home safety  Remove any tripping hazards, such as rugs, cords, and clutter.  Install safety equipment such as grab bars in bathrooms and safety rails on stairs.  Keep rooms and walkways well-lit. Activity   Follow a regular exercise program to stay fit. This will help you maintain your balance. Ask your health care provider what types of exercise are appropriate for you.  If you need a cane or   walker, use it as recommended by your health care provider.  Wear supportive shoes that have nonskid soles. Lifestyle  Do not drink alcohol if your health care provider tells you not to drink.  If you drink alcohol, limit how much you have: ? 0-1 drink a day for women. ? 0-2 drinks a day for men.  Be aware of how much alcohol is in your drink. In the [place] one drink equals one typical bottle of beer (12  oz), one-half glass of wine (5 oz), or one shot of hard liquor (1 oz).  Do not use any products that contain nicotine or tobacco, such as cigarettes and e-cigarettes. If you need help quitting, ask your health care provider. Summary  Having a healthy lifestyle and getting preventive care can help to protect your health and wellness after age 69.  Screening and testing are the best way to find a health problem early and help you avoid having a fall. Early diagnosis and treatment give you the best chance for managing medical conditions that are more common for people who are older than age 69.  Falls are a major cause of broken bones and head injuries in people who are older than age 69. Take precautions to prevent a fall at home.  Work with your health care provider to learn what changes you can make to improve your health and wellness and to prevent falls. This information is not intended to replace advice given to you by your health care provider. Make sure you discuss any questions you have with your health care provider. Document Released: 05/24/2017 Document Revised: 05/24/2017 Document Reviewed: 05/24/2017 Elsevier Interactive Patient Education  2019 [employer]  

## 2018-10-01 NOTE — Progress Notes (Signed)
   Subjective:   Patient ID: Francisco Price, male    DOB: Jan 07, 1951, 68 y.o.   MRN: [id number]  HPI The patient is a 68 YO man coming in for physical.   PMH, [place], social history reviewed and updated.   Review of Systems  Constitutional: Negative.   HENT: Negative.   Eyes: Negative.   Respiratory: Negative for cough, chest tightness and shortness of breath.   Cardiovascular: Negative for chest pain, palpitations and leg swelling.  Gastrointestinal: Negative for abdominal distention, abdominal pain, constipation, diarrhea, nausea and vomiting.  Musculoskeletal: Negative.   Skin: Negative.   Neurological: Negative.   Psychiatric/Behavioral: Negative.     Objective:  Physical Exam Constitutional:      Appearance: He is well-developed.  HENT:     Head: Normocephalic and atraumatic.  Neck:     Musculoskeletal: Normal range of motion.  Cardiovascular:     Rate and Rhythm: Normal rate and regular rhythm.  Pulmonary:     Effort: Pulmonary effort is normal. No respiratory distress.     Breath sounds: Normal breath sounds. No wheezing or rales.  Abdominal:     General: Bowel sounds are normal. There is no distension.     Palpations: Abdomen is soft.     Tenderness: There is no abdominal tenderness. There is no rebound.  Skin:    General: Skin is warm and dry.  Neurological:     Mental Status: He is alert and oriented to person, place, and time.     Coordination: Coordination normal.     Vitals:   10/01/18 1407  BP: 138/70  Pulse: 74  Temp: 98 F (36.7 C)  TempSrc: Oral  SpO2: 97%  Weight: 209 lb (94.8 kg)  Height: 6\' 2"  (1.88 m)    Assessment & Plan:  Flu and prevnar 13 given at visit

## 2018-10-02 ENCOUNTER — Ambulatory Visit: Payer: Self-pay

## 2018-10-02 ENCOUNTER — Other Ambulatory Visit (INDEPENDENT_AMBULATORY_CARE_PROVIDER_SITE_OTHER): Payer: Managed Care, Other (non HMO)

## 2018-10-02 ENCOUNTER — Encounter: Payer: Self-pay | Admitting: Internal Medicine

## 2018-10-02 ENCOUNTER — Ambulatory Visit: Payer: Managed Care, Other (non HMO) | Admitting: Internal Medicine

## 2018-10-02 VITALS — BP 120/80 | HR 68 | Temp 97.9°F | Ht 74.0 in

## 2018-10-02 DIAGNOSIS — Z0000 Encounter for general adult medical examination without abnormal findings: Secondary | ICD-10-CM

## 2018-10-02 DIAGNOSIS — R6884 Jaw pain: Secondary | ICD-10-CM

## 2018-10-02 LAB — COMPREHENSIVE METABOLIC PANEL
ALT: 19 U/L (ref 0–53)
AST: 17 U/L (ref 0–37)
Albumin: 4.1 g/dL (ref 3.5–5.2)
Alkaline Phosphatase: 61 U/L (ref 39–117)
BILIRUBIN TOTAL: 1 mg/dL (ref 0.2–1.2)
BUN: 21 mg/dL (ref 6–23)
CO2: 27 mEq/L (ref 19–32)
Calcium: 9.1 mg/dL (ref 8.4–10.5)
Chloride: 107 mEq/L (ref 96–112)
Creatinine, Ser: 1.27 mg/dL (ref 0.40–1.50)
GFR: 56.33 mL/min — ABNORMAL LOW (ref >60.00)
Glucose, Bld: 87 mg/dL (ref 70–99)
Potassium: 4.4 mEq/L (ref 3.5–5.1)
Sodium: 142 mEq/L (ref 135–145)
Total Protein: 6.1 g/dL (ref 6.0–8.3)

## 2018-10-02 LAB — LIPID PANEL
Cholesterol: 141 mg/dL (ref 0–200)
HDL: 32.1 mg/dL — ABNORMAL LOW (ref >39.00)
LDL Cholesterol: 87 mg/dL (ref 0–99)
NonHDL: 108.77
TRIGLYCERIDES: 107 mg/dL (ref 0.0–149.0)
Total CHOL/HDL Ratio: 4
VLDL: 21.4 mg/dL (ref 0.0–40.0)

## 2018-10-02 LAB — CBC
HCT: 45.2 % (ref 39.0–52.0)
Hemoglobin: 15.6 g/dL (ref 13.0–17.0)
MCHC: 34.5 g/dL (ref 30.0–36.0)
MCV: 90 fl (ref 78.0–100.0)
PLATELETS: 126 10*3/uL — AB (ref 150.0–400.0)
RBC: 5.02 Mil/uL (ref 4.22–5.81)
RDW: 12.7 % (ref 11.5–15.5)
WBC: 6.6 10*3/uL (ref 4.0–10.5)

## 2018-10-02 LAB — SEDIMENTATION RATE: Sed Rate: 6 mm/hr (ref 0–20)

## 2018-10-02 LAB — PSA: PSA: 1.1 ng/mL (ref 0.10–4.00)

## 2018-10-02 NOTE — Telephone Encounter (Signed)
  Answer Assessment - Initial Assessment Questions 1. ONSET: "When did the pain start?" (e.g., minutes, hours, days)     Monday 2. ONSET: "Does the pain come and go, or has it been constant since it started?" (e.g., constant, intermittent, fleeting)    Comes and goes 3. SEVERITY: "How bad is the pain?"   (Scale 1-10; mild, moderate or severe)   - MILD (1-3): doesn't interfere with normal activities    - MODERATE (4-7): interferes with normal activities or awakens from sleep    - SEVERE (8-10): excruciating pain, unable to do any normal activities     Moderate before taking Aleve and Tylenol today pain is 1 now 4. LOCATION: "Where does it hurt?"     Left temple to left jaw 5. RASH: "Is there any redness, rash, or swelling of the face?"     no 6. FEVER: "Do you have a fever?" If so, ask: "What is it, how was it measured, and when did it start?"      Had chills Sunday night 7. OTHER SYMPTOMS: "Do you have any other symptoms?" (e.g., fever, toothache, nasal discharge, nasal congestion, clicking sensation in jaw joint)     Clicking sensation to jaw joint 8. PREGNANCY: "Is there any chance you are pregnant?" "When was your last menstrual period?"     n/a  Protocols used: FACE PAIN-A-AH

## 2018-10-02 NOTE — Progress Notes (Signed)
   Subjective:   Patient ID: Francisco Price, male    DOB: Sep 10, 1950, 68 y.o.   MRN: [id number]  HPI The patient is a 68 YO man coming in for new left jaw pain radiating to his left temple. The left temple is sore to touch. Denies headaches or vision changes. Family history of CAD but no personal history. Denies chest pains or SOB or diaphoresis. This pain started Sunday being bad. He did not have pain yesterday so did not bring up at his physical. Overall it is improving. Took aleve for this and it did help. Denies current dental problems.   Review of Systems  Constitutional: Negative.   HENT: Negative.        Left jaw pain  Eyes: Negative.   Respiratory: Negative for cough, chest tightness and shortness of breath.   Cardiovascular: Negative for chest pain, palpitations and leg swelling.       Left jaw pain  Gastrointestinal: Negative for abdominal distention, abdominal pain, constipation, diarrhea, nausea and vomiting.  Musculoskeletal: Negative.   Skin: Negative.   Neurological: Negative.   Psychiatric/Behavioral: Negative.     Objective:  Physical Exam Constitutional:      Appearance: He is well-developed.  HENT:     Head: Normocephalic and atraumatic.     Comments: No left temporal tenderness Neck:     Musculoskeletal: Normal range of motion.  Cardiovascular:     Rate and Rhythm: Normal rate and regular rhythm.  Pulmonary:     Effort: Pulmonary effort is normal. No respiratory distress.     Breath sounds: Normal breath sounds. No wheezing or rales.  Abdominal:     General: Bowel sounds are normal. There is no distension.     Palpations: Abdomen is soft.     Tenderness: There is no abdominal tenderness. There is no rebound.  Skin:    General: Skin is warm and dry.  Neurological:     Mental Status: He is alert and oriented to person, place, and time.     Coordination: Coordination normal.     Vitals:   10/02/18 1453  BP: 120/80  Pulse: 68  Temp: 97.9 F (36.6  C)  TempSrc: Oral  SpO2: 98%  Height: 6\' 2"  (1.88 m)   EKG: Rate 67, axis normal, interval normal, sinus, no st or t wave changes, no change from prior 2016  Assessment & Plan:  Visit time 25 minutes: greater than 50% of that time was spent in face to face counseling and coordination of care with the patient: counseled about possibly etiology as well as interpretation of labs and history with patient as well as labs needed to check for other conditions.

## 2018-10-02 NOTE — Patient Instructions (Signed)
The EKG is normal and we are checking some blood work today (added on to your labs already done today) and will be checking for some problems.   Try using a sports mouth guard while sleeping to see if this helps. If this helps you may be grinding your teeth.

## 2018-10-02 NOTE — Telephone Encounter (Signed)
Pt c/o left temple dull pain that started Sunday. Pt stated he has been wearing his ear phones and noted it after he took them off Sunday. Pt c/o pain when he touches his left temple that radiates to the left jaw. Pt stated he has noted the left side of his jaw clicking. Pt has been taking Aleve and Tylenol and has been effective. Pt c/o chills Sunday night. No redness rash or swelling noted. Pt given appt today with PCP. Pt given care advice and pt verbalized understanding.

## 2018-10-02 NOTE — Assessment & Plan Note (Signed)
EKG done to rule out cardiac changes given family history. This would be atypical presentation with days of symptoms ongoing. EKG without changes. Checking CBC, CMP, ESR today. Need to rule out temporal arteritis even though he is not having typical headache symptoms. No vision changes. Concern for TMJ and talked about mouth guard if workup negative to see if this helps.

## 2018-10-02 NOTE — Telephone Encounter (Signed)
Called pt 3 times and left 3 voice mails. Pt stated he is having some issue with his left temple. It has been very sore lately. He would like to speak with someone regarding this. Please advise.- Pt spoke with agent and left message. Unable to triage. Sending back to office.

## 2018-10-02 NOTE — Telephone Encounter (Signed)
  Answer Assessment - Initial Assessment Questions 1. ONSET: "When did the pain start?" (e.g., minutes, hours, days)     Sunday 2. ONSET: "Does the pain come and go, or has it been constant since it started?" (e.g., constant, intermittent, fleeting)     *No Answer* 3. SEVERITY: "How bad is the pain?"   (Scale 1-10; mild, moderate or severe)   - MILD (1-3): doesn't interfere with normal activities    - MODERATE (4-7): interferes with normal activities or awakens from sleep    - SEVERE (8-10): excruciating pain, unable to do any normal activities      Moderate but releived with Aleve and tylenol 4. LOCATION: "Where does it hurt?"      Left temple down to jaw 5. RASH: "Is there any redness, rash, or swelling of the face?"     no 6. FEVER: "Do you have a fever?" If so, ask: "What is it, how was it measured, and when did it start?"      Chills Sunday night 7. OTHER SYMPTOMS: "Do you have any other symptoms?" (e.g., fever, toothache, nasal discharge, nasal congestion, clicking sensation in jaw joint)     Clicking sensation to joint 8. PREGNANCY: "Is there any chance you are pregnant?" "When was your last menstrual period?"     n/a  Protocols used: FACE PAIN-A-AH

## 2018-10-02 NOTE — Telephone Encounter (Signed)
  Reason for Disposition . Face pain present > 24 hours  Answer Assessment - Initial Assessment Questions 1. LOCATION: "Where does it hurt?"      Left temple 2. ONSET: "When did the headache start?" (Minutes, hours or days)      Not a H/A 3. PATTERN: "Does the pain come and go, or has it been constant since it started?"     Pain goes to jaw- constant  4. SEVERITY: "How bad is the pain?" and "What does it keep you from doing?"  (e.g., Scale 1-10; mild, moderate, or severe)   - MILD (1-3): doesn't interfere with normal activities    - MODERATE (4-7): interferes with normal activities or awakens from sleep    - SEVERE (8-10): excruciating pain, unable to do any normal activities        Moderate- yest pm and this am  Now a 1/10 5. RECURRENT SYMPTOM: "Have you ever had headaches before?" If so, ask: "When was the last time?" and "What happened that time?"      no 6. CAUSE: "What do you think is causing the headache?"     headset 7. MIGRAINE: "Have you been diagnosed with migraine headaches?" If so, ask: "Is this headache similar?"      no 8. HEAD INJURY: "Has there been any recent injury to the head?"      no 9. OTHER SYMPTOMS: "Do you have any other symptoms?" (fever, stiff neck, eye pain, sore throat, cold symptoms)   Pain goes from temple to jaw 10. PREGNANCY: "Is there any chance you are pregnant?" "When was your last menstrual period?"       n/a  Answer Assessment - Initial Assessment Questions 1. ONSET: "When did the pain start?" (e.g., minutes, hours, days)     *No Answer* 2. ONSET: "Does the pain come and go, or has it been constant since it started?" (e.g., constant, intermittent, fleeting)     constant 3. SEVERITY: "How bad is the pain?"   (Scale 1-10; mild, moderate or severe)   - MILD (1-3): doesn't interfere with normal activities    - MODERATE (4-7): interferes with normal activities or awakens from sleep    - SEVERE (8-10): excruciating pain, unable to do any normal  activities      Moderate- good pain relief Aleve and Tylenol ES 4. LOCATION: "Where does it hurt?"      From temple to jaw 5. RASH: "Is there any redness, rash, or swelling of the face?"     no 6. FEVER: "Do you have a fever?" If so, ask: "What is it, how was it measured, and when did it start?"      Had chills Sunday night 7. OTHER SYMPTOMS: "Do you have any other symptoms?" (e.g., fever, toothache, nasal discharge, nasal congestion, clicking sensation in jaw joint)     Chills Sunday night, clicking sound to jaw.  8. PREGNANCY: "Is there any chance you are pregnant?" "When was your last menstrual period?"     n/a  Protocols used: FACE PAIN-A-AH, HEADACHE-A-AH

## 2018-10-02 NOTE — Telephone Encounter (Signed)
Called and LVM to call back and let us know more about what is going on.

## 2018-11-04 ENCOUNTER — Other Ambulatory Visit: Payer: Self-pay | Admitting: Internal Medicine

## 2019-02-05 ENCOUNTER — Other Ambulatory Visit: Payer: Self-pay | Admitting: Internal Medicine

## 2019-05-11 ENCOUNTER — Other Ambulatory Visit: Payer: Self-pay | Admitting: Internal Medicine

## 2019-07-06 ENCOUNTER — Other Ambulatory Visit: Payer: Self-pay | Admitting: Internal Medicine

## 2019-07-09 ENCOUNTER — Other Ambulatory Visit: Payer: Self-pay | Admitting: Internal Medicine

## 2019-07-10 MED ORDER — TADALAFIL 5 MG PO TABS: 5.0000 mg | ORAL_TABLET | Freq: Every day | ORAL | 6 refills | Status: DC

## 2019-07-29 ENCOUNTER — Telehealth: Payer: Self-pay | Admitting: Internal Medicine

## 2019-07-29 ENCOUNTER — Encounter: Payer: Self-pay | Admitting: Internal Medicine

## 2019-07-29 MED ORDER — TADALAFIL 10 MG PO TABS: 10.0000 mg | ORAL_TABLET | Freq: Every day | ORAL | 3 refills | Status: DC

## 2019-07-29 NOTE — Telephone Encounter (Signed)
Sent in increased dose

## 2019-07-29 NOTE — Addendum Note (Signed)
Addended by: Pricilla Holm A on: 07/29/2019 01:17 PM   Modules accepted: Orders

## 2019-07-29 NOTE — Telephone Encounter (Signed)
Pt called and stated that he would like an increase of medication tadalafil (CIALIS) 5 MG tablet [id number]

## 2019-07-30 MED ORDER — TADALAFIL 10 MG PO TABS: 10.0000 mg | ORAL_TABLET | Freq: Every day | ORAL | 3 refills | Status: DC

## 2019-08-24 ENCOUNTER — Ambulatory Visit: Payer: Managed Care, Other (non HMO)

## 2019-08-29 ENCOUNTER — Ambulatory Visit: Payer: Managed Care, Other (non HMO)

## 2019-08-30 ENCOUNTER — Encounter: Payer: Self-pay | Admitting: Internal Medicine

## 2019-09-05 ENCOUNTER — Ambulatory Visit: Payer: Managed Care, Other (non HMO)

## 2019-09-16 ENCOUNTER — Encounter: Payer: Self-pay | Admitting: Internal Medicine

## 2019-11-17 ENCOUNTER — Other Ambulatory Visit: Payer: Self-pay | Admitting: Internal Medicine

## 2019-11-20 ENCOUNTER — Other Ambulatory Visit: Payer: Self-pay | Admitting: Internal Medicine

## 2019-12-03 ENCOUNTER — Ambulatory Visit (INDEPENDENT_AMBULATORY_CARE_PROVIDER_SITE_OTHER): Payer: Managed Care, Other (non HMO) | Admitting: Internal Medicine

## 2019-12-03 ENCOUNTER — Encounter: Payer: Self-pay | Admitting: Internal Medicine

## 2019-12-03 ENCOUNTER — Other Ambulatory Visit: Payer: Self-pay

## 2019-12-03 VITALS — BP 154/72 | HR 72 | Temp 98.2°F | Ht 74.0 in | Wt 214.8 lb

## 2019-12-03 DIAGNOSIS — Z125 Encounter for screening for malignant neoplasm of prostate: Secondary | ICD-10-CM | POA: Diagnosis not present

## 2019-12-03 DIAGNOSIS — Z0000 Encounter for general adult medical examination without abnormal findings: Secondary | ICD-10-CM

## 2019-12-03 DIAGNOSIS — N138 Other obstructive and reflux uropathy: Secondary | ICD-10-CM | POA: Diagnosis not present

## 2019-12-03 DIAGNOSIS — Z23 Encounter for immunization: Secondary | ICD-10-CM

## 2019-12-03 DIAGNOSIS — N401 Enlarged prostate with lower urinary tract symptoms: Secondary | ICD-10-CM

## 2019-12-03 LAB — CBC
HCT: 45.6 % (ref 39.0–52.0)
Hemoglobin: 15.8 g/dL (ref 13.0–17.0)
MCHC: 34.7 g/dL (ref 30.0–36.0)
MCV: 90.6 fl (ref 78.0–100.0)
Platelets: 141 10*3/uL — ABNORMAL LOW (ref 150.0–400.0)
RBC: 5.03 Mil/uL (ref 4.22–5.81)
RDW: 13 % (ref 11.5–15.5)
WBC: 4.2 10*3/uL (ref 4.0–10.5)

## 2019-12-03 LAB — COMPREHENSIVE METABOLIC PANEL
ALT: 18 U/L (ref 0–53)
AST: 17 U/L (ref 0–37)
Albumin: 4.4 g/dL (ref 3.5–5.2)
Alkaline Phosphatase: 58 U/L (ref 39–117)
BUN: 15 mg/dL (ref 6–23)
CO2: 29 mEq/L (ref 19–32)
Calcium: 9.3 mg/dL (ref 8.4–10.5)
Chloride: 105 mEq/L (ref 96–112)
Creatinine, Ser: 1.33 mg/dL (ref 0.40–1.50)
GFR: 53.22 mL/min — ABNORMAL LOW (ref >60.00)
Glucose, Bld: 113 mg/dL — ABNORMAL HIGH (ref 70–99)
Potassium: 4 mEq/L (ref 3.5–5.1)
Sodium: 139 mEq/L (ref 135–145)
Total Bilirubin: 0.7 mg/dL (ref 0.2–1.2)
Total Protein: 6.4 g/dL (ref 6.0–8.3)

## 2019-12-03 LAB — LIPID PANEL
Cholesterol: 168 mg/dL (ref 0–200)
HDL: 28.8 mg/dL — ABNORMAL LOW (ref >39.00)
LDL Cholesterol: 109 mg/dL — ABNORMAL HIGH (ref 0–99)
NonHDL: 139.45
Total CHOL/HDL Ratio: 6
Triglycerides: 153 mg/dL — ABNORMAL HIGH (ref 0.0–149.0)
VLDL: 30.6 mg/dL (ref 0.0–40.0)

## 2019-12-03 LAB — PSA: PSA: 1.06 ng/mL (ref 0.10–4.00)

## 2019-12-03 MED ORDER — TAMSULOSIN HCL 0.4 MG PO CAPS: 0.4000 mg | ORAL_CAPSULE | Freq: Every day | ORAL | 3 refills | Status: DC

## 2019-12-03 NOTE — Assessment & Plan Note (Signed)
Taking flomax and cialis. Checking PSA.

## 2019-12-03 NOTE — Patient Instructions (Signed)

## 2019-12-03 NOTE — Assessment & Plan Note (Signed)
Flu shot due next season. Covid-19 complete. Pneumonia 23 given to complete series. Shingrix counseled. Tetanus declines today. Colonoscopy counseled. Counseled about sun safety and mole surveillance. Counseled about the dangers of distracted driving. Given 10 year screening recommendations.

## 2019-12-03 NOTE — Progress Notes (Signed)
   Subjective:   Patient ID: Francisco Price, male    DOB: 10/23/1950, 69 y.o.   MRN: [id number]  HPI The patient is a 69 YO man coming in for physical.   PMH, [place], social history reviewed and updated.   Review of Systems  Constitutional: Negative.   HENT: Negative.   Eyes: Negative.   Respiratory: Negative for cough, chest tightness and shortness of breath.   Cardiovascular: Negative for chest pain, palpitations and leg swelling.  Gastrointestinal: Negative for abdominal distention, abdominal pain, constipation, diarrhea, nausea and vomiting.  Musculoskeletal: Negative.   Skin: Negative.   Neurological: Negative.   Psychiatric/Behavioral: Negative.     Objective:  Physical Exam Constitutional:      Appearance: He is well-developed.  HENT:     Head: Normocephalic and atraumatic.  Cardiovascular:     Rate and Rhythm: Normal rate and regular rhythm.  Pulmonary:     Effort: Pulmonary effort is normal. No respiratory distress.     Breath sounds: Normal breath sounds. No wheezing or rales.  Abdominal:     General: Bowel sounds are normal. There is no distension.     Palpations: Abdomen is soft.     Tenderness: There is no abdominal tenderness. There is no rebound.  Musculoskeletal:     Cervical back: Normal range of motion.  Skin:    General: Skin is warm and dry.  Neurological:     Mental Status: He is alert and oriented to person, place, and time.     Coordination: Coordination normal.     Vitals:   12/03/19 1036  BP: (!) 154/72  Pulse: 72  Temp: 98.2 F (36.8 C)  SpO2: 100%  Weight: 214 lb 12.8 oz (97.4 kg)  Height: 6\' 2"  (1.88 m)    This visit occurred during the SARS-CoV-2 public health emergency.  Safety protocols were in place, including screening questions prior to the visit, additional usage of staff PPE, and extensive cleaning of exam room while observing appropriate contact time as indicated for disinfecting solutions.   Assessment & Plan:    Pneumonia 23 given at visit

## 2020-07-25 ENCOUNTER — Other Ambulatory Visit: Payer: Self-pay | Admitting: Internal Medicine

## 2020-07-27 ENCOUNTER — Other Ambulatory Visit: Payer: Self-pay | Admitting: Internal Medicine

## 2020-10-25 ENCOUNTER — Other Ambulatory Visit: Payer: Self-pay | Admitting: Internal Medicine

## 2020-11-20 ENCOUNTER — Other Ambulatory Visit: Payer: Self-pay | Admitting: Internal Medicine

## 2020-11-23 ENCOUNTER — Other Ambulatory Visit: Payer: Self-pay | Admitting: Internal Medicine

## 2020-11-23 NOTE — Telephone Encounter (Signed)
Looks like this was just refilled on 11/20/20

## 2020-12-15 ENCOUNTER — Encounter: Payer: Self-pay | Admitting: Internal Medicine

## 2020-12-15 ENCOUNTER — Other Ambulatory Visit: Payer: Self-pay

## 2020-12-15 ENCOUNTER — Ambulatory Visit (INDEPENDENT_AMBULATORY_CARE_PROVIDER_SITE_OTHER): Payer: Managed Care, Other (non HMO) | Admitting: Internal Medicine

## 2020-12-15 VITALS — BP 118/90 | HR 66 | Temp 97.9°F | Resp 18 | Ht 74.0 in | Wt 212.6 lb

## 2020-12-15 DIAGNOSIS — N138 Other obstructive and reflux uropathy: Secondary | ICD-10-CM

## 2020-12-15 DIAGNOSIS — Z0000 Encounter for general adult medical examination without abnormal findings: Secondary | ICD-10-CM | POA: Diagnosis not present

## 2020-12-15 DIAGNOSIS — N401 Enlarged prostate with lower urinary tract symptoms: Secondary | ICD-10-CM

## 2020-12-15 DIAGNOSIS — D126 Benign neoplasm of colon, unspecified: Secondary | ICD-10-CM | POA: Diagnosis not present

## 2020-12-15 LAB — LIPID PANEL
Cholesterol: 168 mg/dL (ref 0–200)
HDL: 29.9 mg/dL — ABNORMAL LOW (ref >39.00)
LDL Cholesterol: 115 mg/dL — ABNORMAL HIGH (ref 0–99)
NonHDL: 137.73
Total CHOL/HDL Ratio: 6
Triglycerides: 112 mg/dL (ref 0.0–149.0)
VLDL: 22.4 mg/dL (ref 0.0–40.0)

## 2020-12-15 LAB — CBC
HCT: 46.8 % (ref 39.0–52.0)
Hemoglobin: 16.3 g/dL (ref 13.0–17.0)
MCHC: 34.8 g/dL (ref 30.0–36.0)
MCV: 90.1 fl (ref 78.0–100.0)
Platelets: 140 10*3/uL — ABNORMAL LOW (ref 150.0–400.0)
RBC: 5.2 Mil/uL (ref 4.22–5.81)
RDW: 12.7 % (ref 11.5–15.5)
WBC: 4.6 10*3/uL (ref 4.0–10.5)

## 2020-12-15 LAB — COMPREHENSIVE METABOLIC PANEL
ALT: 21 U/L (ref 0–53)
AST: 29 U/L (ref 0–37)
Albumin: 4.4 g/dL (ref 3.5–5.2)
Alkaline Phosphatase: 55 U/L (ref 39–117)
BUN: 16 mg/dL (ref 6–23)
CO2: 26 mEq/L (ref 19–32)
Calcium: 9.6 mg/dL (ref 8.4–10.5)
Chloride: 106 mEq/L (ref 96–112)
Creatinine, Ser: 1.37 mg/dL (ref 0.40–1.50)
GFR: 52.23 mL/min — ABNORMAL LOW (ref >60.00)
Glucose, Bld: 95 mg/dL (ref 70–99)
Potassium: 4.4 mEq/L (ref 3.5–5.1)
Sodium: 141 mEq/L (ref 135–145)
Total Bilirubin: 1 mg/dL (ref 0.2–1.2)
Total Protein: 6.4 g/dL (ref 6.0–8.3)

## 2020-12-15 LAB — PSA: PSA: 1.23 ng/mL (ref 0.10–4.00)

## 2020-12-15 MED ORDER — TAMSULOSIN HCL 0.4 MG PO CAPS: 0.4000 mg | ORAL_CAPSULE | Freq: Every day | ORAL | 3 refills | Status: DC

## 2020-12-15 MED ORDER — TADALAFIL 10 MG PO TABS: ORAL_TABLET | ORAL | 3 refills | Status: DC

## 2020-12-15 NOTE — Assessment & Plan Note (Signed)
Refill cialis and flomax. Checking PSA.

## 2020-12-15 NOTE — Assessment & Plan Note (Signed)
Flu shot yearly. Covid-19 2 shots and booster counseled. Pneumonia complete. Shingrix counseled. Tetanus due declines. Colonoscopy due declines. Counseled about sun safety and mole surveillance. Counseled about the dangers of distracted driving. Given 10 year screening recommendations.

## 2020-12-15 NOTE — Patient Instructions (Addendum)
We will check the labs today.  Health Maintenance, Male Adopting a healthy lifestyle and getting preventive care are important in promoting health and wellness. Ask your health care provider about:  The right schedule for you to have regular tests and exams.  Things you can do on your own to prevent diseases and keep yourself healthy. What should I know about diet, weight, and exercise? Eat a healthy diet  Eat a diet that includes plenty of vegetables, fruits, low-fat dairy products, and lean protein.  Do not eat a lot of foods that are high in solid fats, added sugars, or sodium.   Maintain a healthy weight Body mass index (BMI) is a measurement that can be used to identify possible weight problems. It estimates body fat based on height and weight. Your health care provider can help determine your BMI and help you achieve or maintain a healthy weight. Get regular exercise Get regular exercise. This is one of the most important things you can do for your health. Most adults should:  Exercise for at least 150 minutes each week. The exercise should increase your heart rate and make you sweat (moderate-intensity exercise).  Do strengthening exercises at least twice a week. This is in addition to the moderate-intensity exercise.  Spend less time sitting. Even light physical activity can be beneficial. Watch cholesterol and blood lipids Have your blood tested for lipids and cholesterol at 71 years of age, then have this test every 5 years. You may need to have your cholesterol levels checked more often if:  Your lipid or cholesterol levels are high.  You are older than 71 years of age.  You are at high risk for heart disease. What should I know about cancer screening? Many types of cancers can be detected early and may often be prevented. Depending on your health history and family history, you may need to have cancer screening at various ages. This may include screening  for:  Colorectal cancer.  Prostate cancer.  Skin cancer.  Lung cancer. What should I know about heart disease, diabetes, and high blood pressure? Blood pressure and heart disease  High blood pressure causes heart disease and increases the risk of stroke. This is more likely to develop in people who have high blood pressure readings, are of African descent, or are overweight.  Talk with your health care provider about your target blood pressure readings.  Have your blood pressure checked: ? Every 3-5 years if you are 25-17 years of age. ? Every year if you are 72 years old or older.  If you are between the ages of 25 and 51 and are a current or former smoker, ask your health care provider if you should have a one-time screening for abdominal aortic aneurysm (AAA). Diabetes Have regular diabetes screenings. This checks your fasting blood sugar level. Have the screening done:  Once every three years after age 78 if you are at a normal weight and have a low risk for diabetes.  More often and at a younger age if you are overweight or have a high risk for diabetes. What should I know about preventing infection? Hepatitis B If you have a higher risk for hepatitis B, you should be screened for this virus. Talk with your health care provider to find out if you are at risk for hepatitis B infection. Hepatitis C Blood testing is recommended for:  Everyone born from [birth year] through 1966.  Anyone with known risk factors for hepatitis C. Sexually transmitted infections (  STIs)  You should be screened each year for STIs, including gonorrhea and chlamydia, if: ? You are sexually active and are younger than 71 years of age. ? You are older than 71 years of age and your health care provider tells you that you are at risk for this type of infection. ? Your sexual activity has changed since you were last screened, and you are at increased risk for chlamydia or gonorrhea. Ask your health care  provider if you are at risk.  Ask your health care provider about whether you are at high risk for HIV. Your health care provider may recommend a prescription medicine to help prevent HIV infection. If you choose to take medicine to prevent HIV, you should first get tested for HIV. You should then be tested every 3 months for as long as you are taking the medicine. Follow these instructions at home: Lifestyle  Do not use any products that contain nicotine or tobacco, such as cigarettes, e-cigarettes, and chewing tobacco. If you need help quitting, ask your health care provider.  Do not use street drugs.  Do not share needles.  Ask your health care provider for help if you need support or information about quitting drugs. Alcohol use  Do not drink alcohol if your health care provider tells you not to drink.  If you drink alcohol: ? Limit how much you have to 0-2 drinks a day. ? Be aware of how much alcohol is in your drink. In the [place] one drink equals one 12 oz bottle of beer (355 mL), one 5 oz glass of wine (148 mL), or one 1 oz glass of hard liquor (44 mL). General instructions  Schedule regular health, dental, and eye exams.  Stay current with your vaccines.  Tell your health care provider if: ? You often feel depressed. ? You have ever been abused or do not feel safe at home. Summary  Adopting a healthy lifestyle and getting preventive care are important in promoting health and wellness.  Follow your health care provider's instructions about healthy diet, exercising, and getting tested or screened for diseases.  Follow your health care provider's instructions on monitoring your cholesterol and blood pressure. This information is not intended to replace advice given to you by your health care provider. Make sure you discuss any questions you have with your health care provider. Document Revised: 07/04/2018 Document Reviewed: 07/04/2018 Elsevier Patient Education  2021  [employer].

## 2020-12-15 NOTE — Progress Notes (Signed)
   Subjective:   Patient ID: Francisco Price, male    DOB: 1950/10/28, 70 y.o.   MRN: [id number]  HPI The patient is a 70 YO man coming in for physical.   PMH, [place], social history reviewed and updated  Review of Systems  Constitutional: Negative.   HENT: Negative.   Eyes: Negative.   Respiratory: Negative for cough, chest tightness and shortness of breath.   Cardiovascular: Negative for chest pain, palpitations and leg swelling.  Gastrointestinal: Negative for abdominal distention, abdominal pain, constipation, diarrhea, nausea and vomiting.  Musculoskeletal: Negative.   Skin: Negative.   Neurological: Negative.   Psychiatric/Behavioral: Negative.     Objective:  Physical Exam Constitutional:      Appearance: He is well-developed.  HENT:     Head: Normocephalic and atraumatic.  Cardiovascular:     Rate and Rhythm: Normal rate and regular rhythm.  Pulmonary:     Effort: Pulmonary effort is normal. No respiratory distress.     Breath sounds: Normal breath sounds. No wheezing or rales.  Abdominal:     General: Bowel sounds are normal. There is no distension.     Palpations: Abdomen is soft.     Tenderness: There is no abdominal tenderness. There is no rebound.  Musculoskeletal:     Cervical back: Normal range of motion.  Skin:    General: Skin is warm and dry.  Neurological:     Mental Status: He is alert and oriented to person, place, and time.     Coordination: Coordination normal.     Vitals:   12/15/20 0856  BP: 118/90  Pulse: 66  Resp: 18  Temp: 97.9 F (36.6 C)  TempSrc: Oral  SpO2: 98%  Weight: 212 lb 9.6 oz (96.4 kg)  Height: 6\' 2"  (1.88 m)    This visit occurred during the SARS-CoV-2 public health emergency.  Safety protocols were in place, including screening questions prior to the visit, additional usage of staff PPE, and extensive cleaning of exam room while observing appropriate contact time as indicated for disinfecting solutions.    Assessment & Plan:

## 2020-12-15 NOTE — Assessment & Plan Note (Signed)
Reminded overdue and he declines due to difficulty with prep during first and only colonoscopy.

## 2020-12-17 ENCOUNTER — Ambulatory Visit (HOSPITAL_COMMUNITY)
Admission: EM | Admit: 2020-12-17 | Discharge: 2020-12-17 | Disposition: A | Discharge status: Home / Self Care | Payer: Managed Care, Other (non HMO) | Attending: Emergency Medicine | Admitting: Emergency Medicine

## 2020-12-17 ENCOUNTER — Encounter: Payer: Self-pay | Admitting: Internal Medicine

## 2020-12-17 ENCOUNTER — Other Ambulatory Visit: Payer: Self-pay

## 2020-12-17 ENCOUNTER — Encounter (HOSPITAL_COMMUNITY): Payer: Self-pay | Admitting: Emergency Medicine

## 2020-12-17 DIAGNOSIS — Z7982 Long term (current) use of aspirin: Secondary | ICD-10-CM | POA: Insufficient documentation

## 2020-12-17 DIAGNOSIS — U071 COVID-19: Secondary | ICD-10-CM | POA: Diagnosis not present

## 2020-12-17 DIAGNOSIS — B349 Viral infection, unspecified: Secondary | ICD-10-CM

## 2020-12-17 DIAGNOSIS — Z79899 Other long term (current) drug therapy: Secondary | ICD-10-CM | POA: Insufficient documentation

## 2020-12-17 LAB — POCT RAPID STREP A, ED / UC: Streptococcus, Group A Screen (Direct): NEGATIVE

## 2020-12-17 MED ORDER — KETOROLAC TROMETHAMINE 30 MG/ML IJ SOLN
INTRAMUSCULAR | Status: AC
  Filled 2020-12-17: qty 1

## 2020-12-17 MED ORDER — KETOROLAC TROMETHAMINE 30 MG/ML IJ SOLN
30.0000 mg | Freq: Once | INTRAMUSCULAR | Status: AC
  Administered 2020-12-17: 30 mg via INTRAMUSCULAR

## 2020-12-17 NOTE — ED Provider Notes (Signed)
[place]    CSN: [id number] Arrival date & time: 12/17/20  1417      History   Chief Complaint Chief Complaint  Patient presents with  . Sore Throat    HPI Francisco Price is a 70 y.o. male.   Patient presents with dry sore throat, nasal congestion, non productive cough, fever, chills,  generalized headache and fatigue beginning yesterday. Denies ear pain, sinus pressure, shortness of breath, chest pain, wheezing. Using tylenol, mucinex  and dayquil with minimal relief. Patient is vaccinated. Returned from a cruise earlier this week.   Past Medical History:  Diagnosis Date  . Benign prostatic hypertrophy   . DDD (degenerative disc disease)   . Hx of colonic polyps   . Hypercholesteremia   . Panic attack   . Umbilical hernia     Patient Active Problem List   Diagnosis Date Noted  . Physical exam, annual 02/05/2015  . Benign prostatic hyperplasia with urinary obstruction 08/10/2007  . COLONIC POLYPS 07/10/2007    Past Surgical History:  Procedure Laterality Date  . KNEE ARTHROSCOPY         Home Medications    Prior to Admission medications   Medication Sig Start Date End Date Taking? Authorizing Provider  aspirin 81 MG tablet Take 81 mg by mouth daily.   Yes [provider]  esomeprazole (NEXIUM) 20 MG capsule Take 20 mg by mouth daily at 12 noon.   Yes [provider]  tadalafil (CIALIS) 10 MG tablet TAKE 1 TABLET BY MOUTH ONCE DAILY - 12/15/20  Yes Hoyt Koch, MD  tamsulosin (FLOMAX) 0.4 MG CAPS capsule Take 1 capsule (0.4 mg total) by mouth daily. 12/15/20  Yes Hoyt Koch, MD    Family History Family History  Problem Relation Age of Onset  . Heart disease Father   . Heart disease Mother   . Heart disease Brother     Social History Social History   Tobacco Use  . Smoking status: Never Smoker  . Smokeless tobacco: Never Used  Substance Use Topics  . Alcohol use: Yes    Comment: social use  .  Drug use: No     Allergies   Niacin   Review of Systems Review of Systems  Defer to HPI   Physical Exam Triage Vital Signs ED Triage Vitals  Enc Vitals Group     BP 12/17/20 1459 (!) 178/87     Pulse Rate 12/17/20 1459 79     Resp --      Temp 12/17/20 1459 (!) 101.3 F (38.5 C)     Temp Source 12/17/20 1459 Oral     SpO2 12/17/20 1459 99 %     Weight --      Height --      Head Circumference --      Peak Flow --      Pain Score 12/17/20 1503 5     Pain Loc --      Pain Edu? --      Excl. in [place]? --    No data found.  Updated Vital Signs BP (!) 178/87 (BP Location: Left Arm)   Pulse 79   Temp (!) 101.3 F (38.5 C) (Oral)   SpO2 99%   Visual Acuity Right Eye Distance:   Left Eye Distance:   Bilateral Distance:    Right Eye Near:   Left Eye Near:    Bilateral Near:     Physical Exam Constitutional:  Appearance: He is well-developed and normal weight.  HENT:     Head: Normocephalic.     Right Ear: Tympanic membrane and ear canal normal.     Left Ear: Tympanic membrane and ear canal normal.     Nose: Congestion and rhinorrhea present.     Mouth/Throat:     Mouth: Mucous membranes are moist.     Pharynx: Posterior oropharyngeal erythema present.     Tonsils: No tonsillar exudate or tonsillar abscesses.  Eyes:     Conjunctiva/sclera: Conjunctivae normal.     Pupils: Pupils are equal, round, and reactive to light.  Cardiovascular:     Rate and Rhythm: Normal rate and regular rhythm.     Heart sounds: Normal heart sounds.  Pulmonary:     Effort: Pulmonary effort is normal.     Breath sounds: Normal breath sounds.  Abdominal:     General: Bowel sounds are normal.     Palpations: Abdomen is soft.  Musculoskeletal:     Cervical back: Normal range of motion and neck supple.  Skin:    General: Skin is warm and dry.  Neurological:     General: No focal deficit present.     Mental Status: He is alert and oriented to person, place, and time.   Psychiatric:        Mood and Affect: Mood normal.        Behavior: Behavior normal.      UC Treatments / Results  Labs (all labs ordered are listed, but only abnormal results are displayed) Labs Reviewed  SARS CORONAVIRUS 2 (TAT 6-24 HRS)  POCT RAPID STREP A, ED / UC    EKG   Radiology No results found.  Procedures Procedures (including critical care time)  Medications Ordered in UC Medications  ketorolac (TORADOL) 30 MG/ML injection 30 mg (30 mg Intramuscular Given 12/17/20 1536)    Initial Impression / Assessment and Plan / UC Course  I have reviewed the triage vital signs and the nursing notes.  Pertinent labs & imaging results that were available during my care of the patient were reviewed by me and considered in my medical decision making (see chart for details).  Viral Illness  1, covid pending 2. Rapid strep- negative 3. Toradol 30 mg IM now 4. Continue use of otc medications for symptom management  Final Clinical Impressions(s) / UC Diagnoses   Final diagnoses:  Viral illness     Discharge Instructions     COVID test pending 24 hours, you will be called if positive   Can continue use of mucinex and dayquil for cough and congestion   Can continue use of tylenol for fever  Recommending 400-600 mg of ibuprofen every 6 hours for headache  If appetite decreased, try to drink more fluids to stay hydrated   ED Prescriptions    None     PDMP not reviewed this encounter.   Hans Eden, NP 12/17/20 1606

## 2020-12-17 NOTE — ED Triage Notes (Signed)
Patient c/o sore throat, congestion, productive cough x 1 day, febrile.  Patient states he gets hot and cold chills.  Patient has taken Tylenol, Mucinex and Dayquil.  Patient is vaccinated.

## 2020-12-17 NOTE — Discharge Instructions (Addendum)
COVID test pending 24 hours, you will be called if positive   Can continue use of mucinex and dayquil for cough and congestion   Can continue use of tylenol for fever  Recommending 400-600 mg of ibuprofen every 6 hours for headache  If appetite decreased, try to drink more fluids to stay hydrated

## 2020-12-18 LAB — SARS CORONAVIRUS 2 (TAT 6-24 HRS): SARS Coronavirus 2: POSITIVE — AB

## 2020-12-19 ENCOUNTER — Telehealth: Payer: Self-pay | Admitting: Unknown Physician Specialty

## 2020-12-19 NOTE — Telephone Encounter (Signed)
Called to discuss with patient about COVID-19 symptoms and the use of one of the available treatments for those with mild to moderate Covid symptoms and at a high risk of hospitalization.  Pt appears to qualify for outpatient treatment due to co-morbid conditions and/or a member of an at-risk group in accordance with the FDA Emergency Use Authorization.     Unable to reach pt - LMOM  Francisco Price   

## 2021-01-23 ENCOUNTER — Other Ambulatory Visit: Payer: Self-pay | Admitting: Internal Medicine

## 2021-03-05 ENCOUNTER — Inpatient Hospital Stay (HOSPITAL_COMMUNITY)
Admission: EM | Admit: 2021-03-05 | Discharge: 2021-03-07 | DRG: 057 | Disposition: A | Discharge status: Home / Self Care | Payer: Managed Care, Other (non HMO) | Attending: Internal Medicine | Admitting: Internal Medicine

## 2021-03-05 ENCOUNTER — Emergency Department (HOSPITAL_COMMUNITY): Payer: Managed Care, Other (non HMO)

## 2021-03-05 ENCOUNTER — Inpatient Hospital Stay (HOSPITAL_COMMUNITY): Payer: Managed Care, Other (non HMO)

## 2021-03-05 DIAGNOSIS — K921 Melena: Secondary | ICD-10-CM | POA: Diagnosis not present

## 2021-03-05 DIAGNOSIS — G8194 Hemiplegia, unspecified affecting left nondominant side: Secondary | ICD-10-CM | POA: Diagnosis present

## 2021-03-05 DIAGNOSIS — Z20822 Contact with and (suspected) exposure to covid-19: Secondary | ICD-10-CM | POA: Diagnosis present

## 2021-03-05 DIAGNOSIS — Z006 Encounter for examination for normal comparison and control in clinical research program: Secondary | ICD-10-CM | POA: Diagnosis not present

## 2021-03-05 DIAGNOSIS — I10 Essential (primary) hypertension: Secondary | ICD-10-CM | POA: Diagnosis present

## 2021-03-05 DIAGNOSIS — R29701 NIHSS score 1: Secondary | ICD-10-CM | POA: Diagnosis present

## 2021-03-05 DIAGNOSIS — I6389 Other cerebral infarction: Secondary | ICD-10-CM

## 2021-03-05 DIAGNOSIS — N400 Enlarged prostate without lower urinary tract symptoms: Secondary | ICD-10-CM | POA: Diagnosis present

## 2021-03-05 DIAGNOSIS — I77810 Thoracic aortic ectasia: Secondary | ICD-10-CM | POA: Diagnosis present

## 2021-03-05 DIAGNOSIS — I639 Cerebral infarction, unspecified: Secondary | ICD-10-CM | POA: Diagnosis present

## 2021-03-05 DIAGNOSIS — R200 Anesthesia of skin: Secondary | ICD-10-CM | POA: Diagnosis present

## 2021-03-05 DIAGNOSIS — Z8673 Personal history of transient ischemic attack (TIA), and cerebral infarction without residual deficits: Secondary | ICD-10-CM | POA: Diagnosis present

## 2021-03-05 DIAGNOSIS — Z8719 Personal history of other diseases of the digestive system: Secondary | ICD-10-CM

## 2021-03-05 DIAGNOSIS — Z8249 Family history of ischemic heart disease and other diseases of the circulatory system: Secondary | ICD-10-CM

## 2021-03-05 DIAGNOSIS — E7800 Pure hypercholesterolemia, unspecified: Secondary | ICD-10-CM | POA: Diagnosis present

## 2021-03-05 DIAGNOSIS — Z7982 Long term (current) use of aspirin: Secondary | ICD-10-CM

## 2021-03-05 DIAGNOSIS — R0789 Other chest pain: Secondary | ICD-10-CM | POA: Diagnosis present

## 2021-03-05 LAB — COMPREHENSIVE METABOLIC PANEL
ALT: 23 U/L (ref 0–44)
AST: 20 U/L (ref 15–41)
Albumin: 3.9 g/dL (ref 3.5–5.0)
Alkaline Phosphatase: 55 U/L (ref 38–126)
Anion gap: 9 (ref 5–15)
BUN: 12 mg/dL (ref 8–23)
CO2: 24 mmol/L (ref 22–32)
Calcium: 9.1 mg/dL (ref 8.9–10.3)
Chloride: 107 mmol/L (ref 98–111)
Creatinine, Ser: 1.41 mg/dL — ABNORMAL HIGH (ref 0.61–1.24)
GFR, Estimated: 54 mL/min — ABNORMAL LOW (ref >60)
Glucose, Bld: 122 mg/dL — ABNORMAL HIGH (ref 70–99)
Potassium: 3.8 mmol/L (ref 3.5–5.1)
Sodium: 140 mmol/L (ref 135–145)
Total Bilirubin: 1.1 mg/dL (ref 0.3–1.2)
Total Protein: 6.1 g/dL — ABNORMAL LOW (ref 6.5–8.1)

## 2021-03-05 LAB — CBC
HCT: 45.7 % (ref 39.0–52.0)
Hemoglobin: 16.2 g/dL (ref 13.0–17.0)
MCH: 31.6 pg (ref 26.0–34.0)
MCHC: 35.4 g/dL (ref 30.0–36.0)
MCV: 89.3 fL (ref 80.0–100.0)
Platelets: 151 10*3/uL (ref 150–400)
RBC: 5.12 MIL/uL (ref 4.22–5.81)
RDW: 12.3 % (ref 11.5–15.5)
WBC: 4.7 10*3/uL (ref 4.0–10.5)
nRBC: 0 % (ref 0.0–0.2)

## 2021-03-05 LAB — DIFFERENTIAL
Abs Immature Granulocytes: 0.01 10*3/uL (ref 0.00–0.07)
Basophils Absolute: 0 10*3/uL (ref 0.0–0.1)
Basophils Relative: 1 %
Eosinophils Absolute: 0.1 10*3/uL (ref 0.0–0.5)
Eosinophils Relative: 3 %
Immature Granulocytes: 0 %
Lymphocytes Relative: 26 %
Lymphs Abs: 1.2 10*3/uL (ref 0.7–4.0)
Monocytes Absolute: 0.3 10*3/uL (ref 0.1–1.0)
Monocytes Relative: 7 %
Neutro Abs: 3 10*3/uL (ref 1.7–7.7)
Neutrophils Relative %: 63 %

## 2021-03-05 LAB — PROTIME-INR
INR: 1.1 (ref 0.8–1.2)
Prothrombin Time: 14 seconds (ref 11.4–15.2)

## 2021-03-05 LAB — ETHANOL: Alcohol, Ethyl (B): <10 mg/dL (ref <10)

## 2021-03-05 LAB — I-STAT CHEM 8, ED
BUN: 14 mg/dL (ref 8–23)
Calcium, Ion: 1.16 mmol/L (ref 1.15–1.40)
Chloride: 105 mmol/L (ref 98–111)
Creatinine, Ser: 1.4 mg/dL — ABNORMAL HIGH (ref 0.61–1.24)
Glucose, Bld: 123 mg/dL — ABNORMAL HIGH (ref 70–99)
HCT: 45 % (ref 39.0–52.0)
Hemoglobin: 15.3 g/dL (ref 13.0–17.0)
Potassium: 3.8 mmol/L (ref 3.5–5.1)
Sodium: 143 mmol/L (ref 135–145)
TCO2: 25 mmol/L (ref 22–32)

## 2021-03-05 LAB — URINALYSIS, ROUTINE W REFLEX MICROSCOPIC
Bilirubin Urine: NEGATIVE
Glucose, UA: NEGATIVE mg/dL
Hgb urine dipstick: NEGATIVE
Ketones, ur: NEGATIVE mg/dL
Leukocytes,Ua: NEGATIVE
Nitrite: NEGATIVE
Protein, ur: NEGATIVE mg/dL
Specific Gravity, Urine: 1.039 — ABNORMAL HIGH (ref 1.005–1.030)
pH: 8 (ref 5.0–8.0)

## 2021-03-05 LAB — ECHOCARDIOGRAM COMPLETE
AR max vel: 3.22 cm2
AV Area VTI: 3.23 cm2
AV Area mean vel: 2.54 cm2
AV Mean grad: 3 mmHg
AV Peak grad: 4.4 mmHg
Ao pk vel: 1.05 m/s
Area-P 1/2: 3.46 cm2
MV VTI: 2.45 cm2
Weight: 3414.48 oz

## 2021-03-05 LAB — APTT: aPTT: 31 seconds (ref 24–36)

## 2021-03-05 LAB — RAPID URINE DRUG SCREEN, HOSP PERFORMED
Amphetamines: NOT DETECTED
Barbiturates: NOT DETECTED
Benzodiazepines: NOT DETECTED
Cocaine: NOT DETECTED
Opiates: NOT DETECTED
Tetrahydrocannabinol: NOT DETECTED

## 2021-03-05 LAB — HIV ANTIBODY (ROUTINE TESTING W REFLEX): HIV Screen 4th Generation wRfx: NONREACTIVE

## 2021-03-05 LAB — CBG MONITORING, ED: Glucose-Capillary: 122 mg/dL — ABNORMAL HIGH (ref 70–99)

## 2021-03-05 LAB — TROPONIN I (HIGH SENSITIVITY): Troponin I (High Sensitivity): 5 ng/L (ref <18)

## 2021-03-05 LAB — RESP PANEL BY RT-PCR (FLU A&B, COVID) ARPGX2
Influenza A by PCR: NEGATIVE
Influenza B by PCR: NEGATIVE
SARS Coronavirus 2 by RT PCR: NEGATIVE

## 2021-03-05 MED ORDER — SENNOSIDES-DOCUSATE SODIUM 8.6-50 MG PO TABS: 1.0000 | ORAL_TABLET | Freq: Every evening | ORAL | Status: DC | PRN

## 2021-03-05 MED ORDER — IOHEXOL 350 MG/ML SOLN
100.0000 mL | Freq: Once | INTRAVENOUS | Status: AC | PRN
  Administered 2021-03-05: 100 mL via INTRAVENOUS

## 2021-03-05 MED ORDER — SODIUM CHLORIDE 0.9 % IV SOLN: INTRAVENOUS | Status: DC

## 2021-03-05 MED ORDER — ACETAMINOPHEN 160 MG/5ML PO SOLN
650.0000 mg | ORAL | Status: DC | PRN
  Filled 2021-03-05: qty 20.3

## 2021-03-05 MED ORDER — ALTEPLASE (STROKE) FULL DOSE INFUSION
0.9000 mg/kg | Freq: Once | INTRAVENOUS | Status: AC
  Administered 2021-03-05: 87.1 mg via INTRAVENOUS
  Filled 2021-03-05: qty 100

## 2021-03-05 MED ORDER — STROKE: EARLY STAGES OF RECOVERY BOOK
Freq: Once | Status: AC
  Filled 2021-03-05: qty 1

## 2021-03-05 MED ORDER — SODIUM CHLORIDE 0.9 % IV SOLN
50.0000 mL | Freq: Once | INTRAVENOUS | Status: AC
  Administered 2021-03-05: 50 mL via INTRAVENOUS

## 2021-03-05 MED ORDER — ACETAMINOPHEN 650 MG RE SUPP: 650.0000 mg | RECTAL | Status: DC | PRN

## 2021-03-05 MED ORDER — PANTOPRAZOLE SODIUM 40 MG IV SOLR: 40.0000 mg | Freq: Every day | INTRAVENOUS | Status: DC

## 2021-03-05 MED ORDER — ACETAMINOPHEN 325 MG PO TABS: 650.0000 mg | ORAL_TABLET | ORAL | Status: DC | PRN

## 2021-03-05 NOTE — H&P (Signed)
Neurology H&P  Reason for Consult: Code Stroke, left-sided numbness Referring Physician: Dr. Karle Starch  CC: Left-sided numbness  History is obtained from: Patient, Chart Review  HPI: Francisco Price is a 70 y.o. male with a medical history significant for hypercholesteremia, BPH, degenerative disc disease, and an umbilical hernia who presented to the ED 03/05/2021 for evaluation of left-sided numbness. Patient states that approximately 30 minutes prior to hospital arrival, he was mowing his lawn when he had an acute onset of left chest discomfort, left facial numbness that extended down his left arm into his left hand, and down his left leg into his left foot and immediately came to the ED for evaluation. In triage, patient was activated as a Code Stroke for neurology evaluation.   LKW: 10:00 tpa given?: tPA risks versus benefits discussed with patient on CT table at 10:42 after neurologist reviewed [facility] without evidence of hemorrhage and patient was agreeable to thrombolytic therapy. With EDP at bedside, the decision was made to obtain further CT imaging to rule out acute dissection with complaints of chest discomfort prior to arrival prior to giving tPA. Imaging was complete with radiology review and discussion via telephone without evidence of dissection and tPA was administered at 10:57.   IR Thrombectomy? No, presentation is not consistent with LVO, CT angio imaging reviewed by neurologist without evidence of LVO. Modified Rankin Scale: 0-Completely asymptomatic and back to baseline post- stroke  ROS: A complete ROS was performed and is negative except as noted in the HPI.   Past Medical History:  Diagnosis Date   Benign prostatic hypertrophy    DDD (degenerative disc disease)    Hx of colonic polyps    Hypercholesteremia    Panic attack    Umbilical hernia    Past Surgical History:  Procedure Laterality Date   KNEE ARTHROSCOPY     Family History  Problem Relation Age of Onset    Heart disease Father    Heart disease Mother    Heart disease Brother   Father passed away due to MI Mother passed away with "issues from blood clots"  Social History:   reports that he has never smoked. He has never used smokeless tobacco. He reports current alcohol use. He reports that he does not use drugs.  Medications  Current Facility-Administered Medications:    alteplase (ACTIVASE) 1 mg/mL infusion SOLN 87.1 mg, 0.9 mg/kg, Intravenous, Once **FOLLOWED BY** 0.9 %  sodium chloride infusion, 50 mL, Intravenous, Once, Jerilynn Birkenhead, [facility]  Current Outpatient Medications:    aspirin 81 MG tablet, Take 81 mg by mouth daily., Disp: , Rfl:    esomeprazole (NEXIUM) 20 MG capsule, Take 20 mg by mouth daily at 12 noon., Disp: , Rfl:    tadalafil (CIALIS) 10 MG tablet, TAKE 1 TABLET BY MOUTH ONCE DAILY -, Disp: 90 tablet, Rfl: 3   tamsulosin (FLOMAX) 0.4 MG CAPS capsule, Take 1 capsule by mouth once daily, Disp: 90 capsule, Rfl: 0  Exam: Current vital signs: BP (!) 176/111 (BP Location: Left Arm)   Pulse 95   Temp 97.9 F (36.6 C)   Resp 18   Wt 96.8 kg   SpO2 95%   BMI 27.40 kg/m  Vital signs in last 24 hours: Temp:  [97.9 F (36.6 C)] 97.9 F (36.6 C) (08/12 1022) Pulse Rate:  [95] 95 (08/12 1022) Resp:  [18] 18 (08/12 1022) BP: (176)/(111) 176/111 (08/12 1022) SpO2:  [95 %] 95 % (08/12 1022) Weight:  [96.8 kg] 96.8  kg (08/12 1022)  GENERAL: Awake, alert, in no acute distress Psych: Affect appropriate for situation, patient is calm and cooperative with examination Head: Normocephalic and atraumatic, without obvious abnormality EENT: Normal conjunctivae, dry mucous membranes, no OP obstruction LUNGS: Normal respiratory effort. Non-labored breathing on room air. CV: Regular rate on telemetry, pedal pulses 2+ and symmetric ABDOMEN: Soft, non-tender with visible midline umbilical hernia  Extremities: warm, well perfused, without obvious deformity  NEURO:  Mental Status:  Awake, alert, and oriented to person, place, time, and situation. He/She is able to provide a clear and coherent history of present illness. Speech/Language: speech is intact without dysarthria   Naming, repetition, fluency, and comprehension intact without aphasia  No neglect is noted Cranial Nerves:  II: PERRL. Visual fields full.  III, IV, VI: EOMI without ptosis or nystagmus V: Sensation is intact to light touch and symmetrical to face with some reported sensory deficit to the left temporal face VII: Face is symmetric resting and smiling.  VIII: Hearing is intact to voice IX, X: Palate elevation is symmetric. Phonation normal.  XI: Normal sternocleidomastoid and trapezius muscle strength XII: Tongue protrudes midline without fasciculations.   Motor: 5/5 strength is all muscle groups.  Spontaneous and antigravity movement present without vertical drift throughout. Tone is normal. Bulk is normal.  Sensation: Decreased sensation to light touch in the left hand and posterior left lower extremity compared to the right.   Coordination: FTN intact bilaterally. HKS intact bilaterally. No pronator drift. Alternating hand movements.  DTRs: 2+ throughout.  Gait: Deferred  NIHSS: 1a Level of Conscious.: 0 1b LOC Questions: 0 1c LOC Commands: 0 2 Best Gaze: 0 3 Visual: 0 4 Facial Palsy: 0 5a Motor Arm - left: 0 5b Motor Arm - Right: 0 6a Motor Leg - Left: 0 6b Motor Leg - Right: 0 7 Limb Ataxia: 0 8 Sensory: 1 9 Best Language: 0 10 Dysarthria: 0 11 Extinct. and Inatten.: 0 TOTAL: 1  Labs I have reviewed labs in epic and the results pertinent to this consultation are: CBC    Component Value Date/Time   WBC 4.7 03/05/2021 1033   RBC 5.12 03/05/2021 1033   HGB 15.3 03/05/2021 1038   HCT 45.0 03/05/2021 1038   PLT 151 03/05/2021 1033   MCV 89.3 03/05/2021 1033   MCH 31.6 03/05/2021 1033   MCHC 35.4 03/05/2021 1033   RDW 12.3 03/05/2021 1033   LYMPHSABS 1.2 03/05/2021 1033    MONOABS 0.3 03/05/2021 1033   EOSABS 0.1 03/05/2021 1033   BASOSABS 0.0 03/05/2021 1033   CMP     Component Value Date/Time   NA 143 03/05/2021 1038   K 3.8 03/05/2021 1038   CL 105 03/05/2021 1038   CO2 26 12/15/2020 0917   GLUCOSE 123 (H) 03/05/2021 1038   BUN 14 03/05/2021 1038   CREATININE 1.40 (H) 03/05/2021 1038   CALCIUM 9.6 12/15/2020 0917   PROT 6.4 12/15/2020 0917   ALBUMIN 4.4 12/15/2020 0917   AST 29 12/15/2020 0917   ALT 21 12/15/2020 0917   ALKPHOS 55 12/15/2020 0917   BILITOT 1.0 12/15/2020 0917   GFRNONAA 63.18 06/16/2010 1029   GFRAA 89 08/10/2007 1016   Lipid Panel     Component Value Date/Time   CHOL 168 12/15/2020 0917   TRIG 112.0 12/15/2020 0917   HDL 29.90 (L) 12/15/2020 0917   CHOLHDL 6 12/15/2020 0917   VLDL 22.4 12/15/2020 0917   LDLCALC 115 (H) 12/15/2020 0917   LDLDIRECT 121.0 09/26/2017  1549   Lab Results  Component Value Date   HGBA1C 5.2 10/09/2008   Imaging I have reviewed the images obtained:  CT-scan of the brain 03/05/2021: No evidence of acute intracranial abnormality. ASPECTS is 10. Chronic appearing lacunar infarct within the right caudate nucleus.  CT angio head and neck WWO 03/05/2021: CTA neck: The common carotid, internal carotid and vertebral arteries are patent within the neck without stenosis. Minimal atherosclerotic plaque within the bilateral carotid systems within the neck, and within the proximal right subclavian artery.   CTA head: 1. No intracranial large vessel occlusion or proximal high-grade arterial stenosis identified. 2. Mild atherosclerotic plaque within the intracranial internal carotid arteries with no more than mild stenosis. 3. Mild atherosclerotic narrowing of the right vertebral artery at the level skull base. 4. 1-2 mm posteroinferiorly projecting vascular protrusion arising from the paraclinoid right ICA, which may reflect an aneurysm or infundibulum.  CT angio chest/abdomen/pelvis for dissection  03/05/2021:  1. No acute findings in the chest, abdomen, or pelvis. Specifically, no aortic dissection as queried. These results were called by telephone at the time of interpretation on 03/05/2021 at 11:34 am to provider Dr. Rory Percy, who verbally acknowledged these results. 2. Lytic lesions in the sternal manubrium and upper sternal body are favored to represent hemangiomas, however multiple myeloma is also in the differential. Recommend MRI with and without contrast on a non-emergent basis for further characterization. No other suspicious osseous lesions. 3. A 0.4 cm subpleural nodule is noted in the right lower lobe. No follow-up needed if patient is low-risk. Non-contrast chest CT can be considered in 12 months if patient is high-risk. This recommendation follows the consensus statement: Guidelines for Management of Incidental Pulmonary Nodules Detected on CT Images: From the Fleischner Society 2017; Radiology 2017; 284:228-243. 4. Atherosclerosis at the origin of the right renal artery with moderate luminal narrowing. 5. A 1.6 cm hypodense lesion in the superior left lobe of the liver has benign features and is not significantly changed compared to prior CT 12/04/2012. No further workup is indicated.  Assessment: 70 y.o. male with history significant for HLD on home ASA 81 mg who presented to the ED for evaluation of acute onset of left-sided sensory deficits with left chest discomfort PTA while mowing his lawn.  - Examination reveals patient with decreased sensation to light touch to the left hand, posterior LLE, and left temporal face. Initial NIHSS of 1.  - CT was without acute intracranial abnormality, CT angio was without LVO. tPA risks versus benefits were discussed with patient who was agreeable to thrombolytic therapy, however, due to chest discomfort, the decision was made with EDP at bedside to obtain CT imaging to rule out dissection prior to tPA administration. tPA was administered at 10:58  following imaging review by radiologist.  - CT angio head and neck without LVO or proximal high-grade stenosis. Minimal atherosclerotic plaque within bilateral carotid systems in the neck and within the proximal right subclavian artery, bilateral ICAs, and right vertebral artery at the skull base.  - Stroke risk factors include patient's advanced age and hypercholesteremia.  Plan: Acute Ischemic Stroke determined by clinical assessment Atherosclerosis  Acuity: Acute Current Suspected Etiology: Possibly atherosclerosis  Continue Evaluation:  -Admit to: ICU -Hold Aspirin until 24 hour post tPA neuroimaging is stable and without evidence of bleeding -Blood pressure control, goal of SYS < 180 -MRI/ECHO/A1C/Lipid panel. -Hyperglycemia management per SSI as needed to maintain glucose 140-183m/dL -PT/OT/ST therapies and recommendations when able  CNS Acute Ischemic Stroke  determined by clinical assessment -Close neuro monitoring -NPO until cleared by speech -Advance diet as tolerated  RESP - Maintain SpO2 > 92% - Supplemental oxygen as needed  CV No history of hypertension noted  -Aggressive BP control, goal SBP < 180  No history of heart disease noted -TTE pending  Hypercholesterolemia, unspecified  - Initiate statin for goal LDL < 70  No history of abnormal rhythm - EKG - If no arrhythmias captured, consider 30-day event monitor  HEME Hemoglobin on admission 15.3 -Monitor -Transfuse as needed for hgb < 7  Coagulopathy secondary to anticoagulation -Transfuse platelets PRN  ENDO No known history of diabetes - goal HgbA1c < 7%  GI/GU -Gentle hydration  Fluid/Electrolyte Disorders AM BMP -Replete as needed -Repeat labs -Trend  ID -CXR -UA -Monitor WBC curve -Follow fever curve  Prophylaxis DVT:  SCDs GI: PPI Bowel: Docusate / Senna  Diet: NPO until cleared by speech  Code Status: Full Code    THE FOLLOWING WERE PRESENT ON ADMISSION: CNS -  Acute  Ischemic Stroke Respiratory - N/A Cardiovascular - Hypertension on arrival in triage with SBP in the 170's Infectious - N/A GI - N/A Renal -  N/A Heme-  N/A Cancer - N/A Trauma - N/A  Anibal Henderson, AGAC-NP [facility] Pager: [phone]  Attending Neurohospitalist Addendum Patient seen and examined with APP/Resident. Agree with the history and physical as documented above. Agree with the plan as documented, which I helped formulate. I have independently reviewed the chart, obtained history, review of systems and examined the patient.I have personally reviewed pertinent head/neck/spine imaging (CT/MRI).   CRITICAL CARE ATTESTATION Performed by: Amie Portland, MD Total critical care time: 49 minutes Critical care time was exclusive of separately billable procedures and treating other patients and/or supervising APPs/Residents/Students Critical care was necessary to treat or prevent imminent or life-threatening deterioration due to acute ischemic stroke, IV thrombolysis  This patient is critically ill and at significant risk for neurological worsening and/or death and care requires constant monitoring. Critical care was time spent personally by me on the following activities: development of treatment plan with patient and/or surrogate as well as nursing, discussions with consultants, evaluation of patient's response to treatment, examination of patient, obtaining history from patient or surrogate, ordering and performing treatments and interventions, ordering and review of laboratory studies, ordering and review of radiographic studies, pulse oximetry, re-evaluation of patient's condition, participation in multidisciplinary rounds and medical decision making of high complexity in the care of this patient.  Please feel free to call with any questions.  -- Amie Portland, MD Neurologist [facility] Pager: [phone]

## 2021-03-05 NOTE — Research (Signed)
Patient meets criteria for OPTIMISTmain trial. Pt was given tPA and has NIHSS < 10 with no critical care needs.   Patient given Patient Information Sheet and verbalized understanding about data collection, discharge questions, and 90 day follow-up. All questions answered.   Mohammed Kindle, RN BSN  Stroke Response Coordinator

## 2021-03-05 NOTE — ED Triage Notes (Signed)
Pt here with c/o left arm numbness and weakness along with some cp , started 30 mins ago while mowing grass

## 2021-03-05 NOTE — ED Notes (Signed)
Pt experienced L tingling and numbness during mowing lawn this AM. Small chest pain during this episode. TPA started at 1058, stroke RN at bedside. Pt GCS 15, minimal sensation change remains in L side.

## 2021-03-05 NOTE — Progress Notes (Signed)
PHARMACIST CODE STROKE RESPONSE  Notified to mix tPA at 1054 by Dr. Estevan Ryder Delivered tPA to RN at 1057  tPA dose = 8.'7mg'$  bolus over 1 minute followed by 78.'4mg'$  for a total dose of 87.'1mg'$  over 1 hour  Issues/delays encountered (if applicable): Dissection study.  Francisco Price 03/05/21 11:00 AM

## 2021-03-05 NOTE — ED Provider Notes (Signed)
Emergency Medicine Provider Triage Evaluation Note  Francisco Price , a 70 y.o. male  was evaluated in triage.  Pt complains of patient is a 70 year old male with past medical history significant for HLD, BPH, DDD  Patient is presented to the ER today with left arm numbness and subjective weakness that began 30 minutes prior to arrival in the ER approximately 945.Marland Kitchen Patient was seen in triage.  He is complaining of ongoing left arm numbness and states that he feels weaker in the left arm states that his left leg feels normal now.  Also is endorsing some chest pain he states it is not ripping or tearing or radiating to his back.    Review of Systems  Positive: Chest pain, left arm numbness Negative: Fever  Physical Exam  BP (!) 176/111 (BP Location: Left Arm)   Pulse 95   Temp 97.9 F (36.6 C)   Resp 18   SpO2 95%  Gen:   Awake, no distress  Resp:  Normal effort  MSK:   Moves extremities without difficulty  Other:  No obvious murmur.  Lungs clear to auscultation all fields.  Moving all 4 extremities with good strength.  Extraocular movements intact.  Vision grossly intact.  Speech is clear no dysarthria or aphasia.  Swallows without difficulty managing secretions well.  Alert and oriented x3.  Subjective numbness on left arm to the shoulder.  Medical Decision Making  Medically screening exam initiated at 10:36 AM.  Appropriate orders placed.  Francisco Price was informed that the remainder of the evaluation will be completed by another provider, this initial triage assessment does not replace that evaluation, and the importance of remaining in the ED until their evaluation is complete.  Code stroke activated from triage.  CT angio of chest abdomen pelvis dissection study was also obtained given patient is having chest pain and neurologic abnormalities.  Discussed with stroke nurse.  Patient wheeled to CT scan.  He is breathing without difficulty apart from hypertension and mild  tachycardia vital signs are overall reassuring.  Patient's airway is clear currently.  He will be monitored in CT by stroke team.   Tedd Sias, PA 03/05/21 1040    Pattricia Boss, MD 03/09/21 1845

## 2021-03-05 NOTE — ED Provider Notes (Signed)
[place] EMERGENCY DEPARTMENT Provider Note  CSN: [id number] Arrival date & time: 03/05/21 1017    History No chief complaint on file.   Francisco Price is a 70 y.o. male with history of HLD reports onset of left chest discomfort and L arm/leg and face numbness. He has some subjective weakness of L side was well. Came by POC, last known well about 0945, Code stroke activated in Triage. Patient taken emergently to CT to eval stroke and/or aortic dissection.    Past Medical History:  Diagnosis Date   Benign prostatic hypertrophy    DDD (degenerative disc disease)    Hx of colonic polyps    Hypercholesteremia    Panic attack    Umbilical hernia     Past Surgical History:  Procedure Laterality Date   KNEE ARTHROSCOPY      Family History  Problem Relation Age of Onset   Heart disease Father    Heart disease Mother    Heart disease Brother     Social History   Tobacco Use   Smoking status: Never   Smokeless tobacco: Never  Substance Use Topics   Alcohol use: Yes    Comment: social use   Drug use: No     Home Medications Prior to Admission medications   Medication Sig Start Date End Date Taking? Authorizing Provider  aspirin 81 MG tablet Take 81 mg by mouth daily.   Yes [provider]  esomeprazole (NEXIUM) 20 MG capsule Take 20 mg by mouth daily.   Yes [provider]  loratadine (CLARITIN) 10 MG tablet Take 10 mg by mouth daily as needed for allergies.   Yes [provider]  tadalafil (CIALIS) 10 MG tablet TAKE 1 TABLET BY MOUTH ONCE DAILY - Patient taking differently: Take 10 mg by mouth daily. Continuous 12/15/20  Yes Hoyt Koch, MD  tamsulosin [facility]) 0.4 MG CAPS capsule Take 1 capsule by mouth once daily 01/26/21  Yes Hoyt Koch, MD     Allergies    Niacin   Review of Systems   Review of Systems Unable to assess due to acuity of condition.    Physical Exam BP (!) 159/86   Pulse 83   Temp 97.9  F (36.6 C)   Resp (!) 21   Wt 96.8 kg   SpO2 98%   BMI 27.40 kg/m   Physical Exam Vitals and nursing note reviewed.  Constitutional:      Appearance: Normal appearance.  HENT:     Head: Normocephalic and atraumatic.     Nose: Nose normal.     Mouth/Throat:     Mouth: Mucous membranes are moist.  Eyes:     Extraocular Movements: Extraocular movements intact.     Conjunctiva/sclera: Conjunctivae normal.  Cardiovascular:     Rate and Rhythm: Normal rate.  Pulmonary:     Effort: Pulmonary effort is normal.     Breath sounds: Normal breath sounds.  Abdominal:     General: Abdomen is flat.     Palpations: Abdomen is soft.     Tenderness: There is no abdominal tenderness.  Musculoskeletal:        General: No swelling. Normal range of motion.     Cervical back: Neck supple.  Skin:    General: Skin is warm and dry.  Neurological:     Mental Status: He is alert.     Comments: L face arm and leg numbness, normal strength, for full NIHSS please see Neuro  notes.   Psychiatric:        Mood and Affect: Mood normal.     ED Results / Procedures / Treatments   Labs (all labs ordered are listed, but only abnormal results are displayed) Labs Reviewed  I-STAT CHEM 8, ED - Abnormal; Notable for the following components:      Result Value   Creatinine, Ser 1.40 (*)    Glucose, Bld 123 (*)    All other components within normal limits  CBG MONITORING, ED - Abnormal; Notable for the following components:   Glucose-Capillary 122 (*)    All other components within normal limits  RESP PANEL BY RT-PCR (FLU A&B, COVID) ARPGX2  PROTIME-INR  APTT  CBC  DIFFERENTIAL  ETHANOL  COMPREHENSIVE METABOLIC PANEL  RAPID URINE DRUG SCREEN, HOSP PERFORMED  URINALYSIS, ROUTINE W REFLEX MICROSCOPIC  HIV ANTIBODY (ROUTINE TESTING W REFLEX)  TROPONIN I (HIGH SENSITIVITY)    EKG EKG Interpretation  Date/Time:  Friday March 05 2021 11:02:55 EDT Ventricular Rate:  93 PR Interval:  176 QRS  Duration: 94 QT Interval:  356 QTC Calculation: 443 R Axis:   54 Text Interpretation: Sinus rhythm No significant change since last tracing EARLIER SAME DATE Confirmed by Calvert Cantor [phone]) on 03/05/2021 11:08:06 AM  Radiology CT HEAD CODE STROKE WO CONTRAST  Result Date: 03/05/2021 CLINICAL DATA:  Code stroke. Neuro deficit, acute, stroke suspected. EXAM: CT HEAD WITHOUT CONTRAST TECHNIQUE: Contiguous axial images were obtained from the base of the skull through the vertex without intravenous contrast. COMPARISON:  None FINDINGS: Brain: Cerebral volume is normal for age. Small chronic appearing lacunar infarct within the right caudate nucleus (series 2, image 19) (series 5, image 29). There is no acute intracranial hemorrhage. No demarcated cortical infarct. No extra-axial fluid collection. No evidence of an intracranial mass. No midline shift. Vascular: No skull.  Atherosclerotic calcifications. Skull: Normal. Negative for fracture or focal lesion. Sinuses/Orbits: Visualized orbits show no acute finding. Mild mucosal thickening within the anterior ethmoid air cells bilaterally. ASPECTS [facility] Stroke Program Early CT Score) - Ganglionic level infarction (caudate, lentiform nuclei, internal capsule, insula, M1-M3 cortex): 7 - Supraganglionic infarction (M4-M6 cortex): 3 Total score (0-10 with 10 being normal): 10 (when discounting the chronic appearing right caudate nucleus infarct). These results were communicated to Dr. Rory Percy at 10:51 amon 8/12/2022by text page via the [facility] messaging system. IMPRESSION: No evidence of acute intracranial abnormality. ASPECTS is 10. Chronic appearing lacunar infarct within the right caudate nucleus. Electronically Signed   By: Kellie Simmering DO   On: 03/05/2021 10:52   CT ANGIO HEAD NECK W WO CM (CODE STROKE)  Result Date: 03/05/2021 CLINICAL DATA:  Neuro deficit, acute, stroke suspected. Additional provided: Chest pain, left-sided weakness. EXAM: CT ANGIOGRAPHY  HEAD AND NECK TECHNIQUE: Multidetector CT imaging of the head and neck was performed using the standard protocol during bolus administration of intravenous contrast. Multiplanar CT image reconstructions and MIPs were obtained to evaluate the vascular anatomy. Carotid stenosis measurements (when applicable) are obtained utilizing NASCET criteria, using the distal internal carotid diameter as the denominator. CONTRAST:  186m OMNIPAQUE IOHEXOL 350 MG/ML SOLN COMPARISON:  Noncontrast head CT performed earlier today 03/05/2021. FINDINGS: CTA NECK FINDINGS Aortic arch: Standard aortic branching. Atherosclerotic plaque within the proximal right subclavian artery. No hemodynamically significant innominate or proximal subclavian artery stenosis. Right carotid system: CCA and ICA patent within the neck without stenosis. Minimal soft and calcified plaque within the proximal right ICA. Left carotid system: CCA and ICA patent  within the neck without stenosis. Minimal soft and calcified plaque within the proximal ICA. Vertebral arteries: Vertebral arteries patent within the neck without stenosis. Skeleton: Cervical and upper thoracic spondylosis. No acute bony abnormality or aggressive osseous lesion. Other neck: No neck mass or cervical lymphadenopathy. Upper chest: No consolidation within the imaged lung apices. Review of the MIP images confirms the above findings CTA HEAD FINDINGS Anterior circulation: The intracranial internal carotid arteries are patent. Atherosclerotic plaque within both vessels with no more than mild stenosis. The M1 middle cerebral arteries are patent. No M2 proximal branch occlusion or high-grade proximal stenosis is identified. The anterior cerebral arteries are patent. Hypoplastic right A1 segment. 1-2 mm posteroinferiorly projecting vascular protrusion arising from the paraclinoid right ICA, which may reflect an infundibulum or aneurysm. Posterior circulation: The intracranial vertebral arteries are  patent. Mild atherosclerotic narrowing of the right vertebral artery at the level of the skull base. The basilar artery is patent. The posterior cerebral arteries are patent. Posterior communicating arteries are hypoplastic or absent bilaterally. Venous sinuses: Within the limitations of contrast timing, no convincing thrombus. Anatomic variants: As described. Review of the MIP images confirms the above findings No emergent large vessel occlusion within the head or neck. These results were communicated to Dr. Rory Percy at 11:21 amon 03/05/2021 by text page via the [facility] messaging system. IMPRESSION: CTA neck: The common carotid, internal carotid and vertebral arteries are patent within the neck without stenosis. Minimal atherosclerotic plaque within the bilateral carotid systems within the neck, and within the proximal right subclavian artery. CTA head: 1. No intracranial large vessel occlusion or proximal high-grade arterial stenosis identified. 2. Mild atherosclerotic plaque within the intracranial internal carotid arteries with no more than mild stenosis. 3. Mild atherosclerotic narrowing of the right vertebral artery at the level skull base. 4. 1-2 mm posteroinferiorly projecting vascular protrusion arising from the paraclinoid right ICA, which may reflect an aneurysm or infundibulum. Electronically Signed   By: Kellie Simmering DO   On: 03/05/2021 11:21    Procedures .Critical Care  Date/Time: 03/05/2021 11:34 AM Performed by: Truddie Hidden, MD Authorized by: Truddie Hidden, MD   Critical care provider statement:    Critical care time (minutes):  45   Critical care was necessary to treat or prevent imminent or life-threatening deterioration of the following conditions:  CNS failure or compromise   Critical care was time spent personally by me on the following activities:  Discussions with consultants, evaluation of patient's response to treatment, examination of patient, ordering and performing  treatments and interventions, ordering and review of laboratory studies, ordering and review of radiographic studies, pulse oximetry, re-evaluation of patient's condition, obtaining history from patient or surrogate and review of old charts   Care discussed with: admitting provider    Medications Ordered in the ED Medications  alteplase (ACTIVASE) 1 mg/mL infusion SOLN 87.1 mg (has no administration in time range)    Followed by  0.9 %  sodium chloride infusion (has no administration in time range)   stroke: mapping our early stages of recovery book (has no administration in time range)  0.9 %  sodium chloride infusion (has no administration in time range)  acetaminophen (TYLENOL) tablet 650 mg (has no administration in time range)    Or  acetaminophen (TYLENOL) 160 MG/5ML solution 650 mg (has no administration in time range)    Or  acetaminophen (TYLENOL) suppository 650 mg (has no administration in time range)  senna-docusate (Senokot-S) tablet 1 tablet (has no  administration in time range)  pantoprazole (PROTONIX) injection 40 mg (has no administration in time range)  iohexol (OMNIPAQUE) 350 MG/ML injection 100 mL (100 mLs Intravenous Contrast Given 03/05/21 1057)     MDM Rules/Calculators/A&P MDM Patient seen in [place] with Neurology team. Imaging and labs ordered.   ED Course  I have reviewed the triage vital signs and the nursing notes.  Pertinent labs & imaging results that were available during my care of the patient were reviewed by me and considered in my medical decision making (see chart for details).  Clinical Course as of 03/05/21 1134  Fri Mar 05, 2021  1057 CT head is neg. Has had CTA head/neck as well as CAP for dissection given his chest discomfort. Neuro at bedside planning tPA if no dissection.  [CS]  [id number] CBC is normal. Coags neg.  [CS]  [id number] Patient given tPA. Will be admitted to Neuro stroke service with Dr. Rory Percy.  [CS]    Clinical Course User Index [CS]  Truddie Hidden, MD    Final Clinical Impression(s) / ED Diagnoses Final diagnoses:  Acute CVA (cerebrovascular accident) [facility])    Rx / [place] Orders ED Discharge Orders     None        Truddie Hidden, MD 03/05/21 1135

## 2021-03-05 NOTE — Progress Notes (Signed)
  Echocardiogram 2D Echocardiogram has been performed.  Francisco Price 03/05/2021, 1:52 PM

## 2021-03-05 NOTE — Progress Notes (Addendum)
NIHSS For optimistmain trial  NIHSS -1  OK for OPTIMIST main low intensity post tPA monitoring.  Epic issue prevented orderset placement - verbal orders were given at the time of this assessment.  -- Amie Portland, MD Neurologist [facility] Pager: [phone]

## 2021-03-05 NOTE — ED Notes (Signed)
Pt to room from CT  

## 2021-03-05 NOTE — Code Documentation (Signed)
Stroke Response Nurse Documentation Code Documentation  Francisco Price is a 70 y.o. male arriving to [place] ED via Private Vehicle on 03/05/21 with past medical hx of HLD. Code stroke was activated by ED. Patient from home where he was LKW at 1000 and now complaining of left sided tingling.  Stroke team at the bedside on patient activation. Patient cleared for CT by PA [place]. Patient to CT with team. NIHSS 1, see documentation for details and code stroke times. Patient with left decreased sensation on exam. The following imaging was completed: CT, CTA head and neck. Patient is a candidate for tPA. Started in CT, see MAR. Care/Plan post tPA protocol. NIHSS and vital signs Q15 x2hrs then reassess full NIHSS for optimist. Maintain BP under 180/105. Bedside handoff with ED RN Peter Congo.    Meda Klinefelter  Stroke Response RN

## 2021-03-06 ENCOUNTER — Inpatient Hospital Stay (HOSPITAL_COMMUNITY): Payer: Managed Care, Other (non HMO)

## 2021-03-06 LAB — HEMOGLOBIN A1C
Hgb A1c MFr Bld: 5.3 % (ref 4.8–5.6)
Mean Plasma Glucose: 105.41 mg/dL

## 2021-03-06 LAB — CBC
HCT: 43 % (ref 39.0–52.0)
HCT: 47.1 % (ref 39.0–52.0)
Hemoglobin: 14.9 g/dL (ref 13.0–17.0)
Hemoglobin: 16.7 g/dL (ref 13.0–17.0)
MCH: 31.3 pg (ref 26.0–34.0)
MCH: 31.5 pg (ref 26.0–34.0)
MCHC: 34.7 g/dL (ref 30.0–36.0)
MCHC: 35.5 g/dL (ref 30.0–36.0)
MCV: 90.3 fL (ref 80.0–100.0)
MCV: 88.7 fL (ref 80.0–100.0)
Platelets: 143 10*3/uL — ABNORMAL LOW (ref 150–400)
Platelets: 161 10*3/uL (ref 150–400)
RBC: 4.76 MIL/uL (ref 4.22–5.81)
RBC: 5.31 MIL/uL (ref 4.22–5.81)
RDW: 12.5 % (ref 11.5–15.5)
RDW: 12.3 % (ref 11.5–15.5)
WBC: 4.7 10*3/uL (ref 4.0–10.5)
WBC: 5.1 10*3/uL (ref 4.0–10.5)
nRBC: 0 % (ref 0.0–0.2)
nRBC: 0 % (ref 0.0–0.2)

## 2021-03-06 LAB — LIPID PANEL
Cholesterol: 163 mg/dL (ref 0–200)
HDL: 27 mg/dL — ABNORMAL LOW (ref >40)
LDL Cholesterol: 114 mg/dL — ABNORMAL HIGH (ref 0–99)
Total CHOL/HDL Ratio: 6 RATIO
Triglycerides: 111 mg/dL (ref <150)
VLDL: 22 mg/dL (ref 0–40)

## 2021-03-06 MED ORDER — CHLORHEXIDINE GLUCONATE CLOTH 2 % EX PADS
6.0000 | MEDICATED_PAD | Freq: Every day | CUTANEOUS | Status: DC
  Administered 2021-03-06 – 2021-03-07 (×2): 6 via TOPICAL

## 2021-03-06 MED ORDER — PANTOPRAZOLE SODIUM 40 MG PO TBEC
40.0000 mg | DELAYED_RELEASE_TABLET | Freq: Every day | ORAL | Status: DC
  Administered 2021-03-06: 40 mg via ORAL
  Filled 2021-03-06: qty 1

## 2021-03-06 MED ORDER — ATORVASTATIN CALCIUM 80 MG PO TABS
80.0000 mg | ORAL_TABLET | Freq: Every day | ORAL | Status: DC
  Administered 2021-03-06 – 2021-03-07 (×2): 80 mg via ORAL
  Filled 2021-03-06 (×2): qty 1

## 2021-03-06 NOTE — Progress Notes (Signed)
OT Cancellation Note  Patient Details Name: THURMON GREENEY MRN: [id number] DOB: 1951-04-01   Cancelled Treatment:    Reason Eval/Treat Not Completed: Active bedrest order. Will assess once activity orders updated. thanks  [place], OT/L   Acute OT Clinical Specialist [place] Pager [phone] Office [phone]  03/06/2021, 7:33 AM

## 2021-03-06 NOTE — Progress Notes (Signed)
PT Cancellation Note  Patient Details Name: Francisco Price MRN: [id number] DOB: [birth year]   Cancelled Treatment:    Reason Eval/Treat Not Completed: Active bedrest order  Wyona Almas, PT, DPT Acute Rehabilitation Services Pager [phone] Office [phone]    Deno Etienne 03/06/2021, 8:53 AM

## 2021-03-06 NOTE — Evaluation (Signed)
Physical Therapy Evaluation and Discharge Patient Details Name: Francisco Price MRN: [id number] DOB: 1950-09-27 Today's Date: 03/06/2021   History of Present Illness  Pt is a 70 y.o. M who presents for evaluation of left sided numbness. Received tPA 10:57 03/05/2021. MRI negative for acute abnormality; showing small chronic lacunar infarcts of right basal ganglia. Significant PMH: hypercholesteremia, BPH  Clinical Impression  Patient evaluated by Physical Therapy with no further acute PT needs identified. Pt denies residual deficits. Pt ambulating 250 feet with no assistive device and negotiated a half flight of stairs without physical difficulty. Scoring 24/24 on the Dynamic Gait Index, indicating he is not at high risk for falls. Reviewed BEFAST stroke symptoms. All education has been completed and the patient has no further questions. No follow-up Physical Therapy or equipment needs. PT is signing off. Thank you for this referral.     Follow Up Recommendations No PT follow up    Equipment Recommendations  None recommended by PT    Recommendations for Other Services       Precautions / Restrictions Precautions Precautions: None Restrictions Weight Bearing Restrictions: No      Mobility  Bed Mobility               General bed mobility comments: OOB in chair    Transfers Overall transfer level: Independent Equipment used: None                Ambulation/Gait Ambulation/Gait assistance: Independent Gait Distance (Feet): 250 Feet Assistive device: None Gait Pattern/deviations: WFL(Within Functional Limits)        Stairs Stairs: Yes Stairs assistance: Independent Stair Management: No rails Number of Stairs: 12    Wheelchair Mobility    Modified Rankin (Stroke Patients Only) Modified Rankin (Stroke Patients Only) Pre-Morbid Rankin Score: No symptoms Modified Rankin: No symptoms     Balance Overall balance assessment: No apparent balance  deficits (not formally assessed)                               Standardized Balance Assessment Standardized Balance Assessment : Dynamic Gait Index   Dynamic Gait Index Level Surface: Normal Change in Gait Speed: Normal Gait with Horizontal Head Turns: Normal Gait with Vertical Head Turns: Normal Gait and Pivot Turn: Normal Step Over Obstacle: Normal Step Around Obstacles: Normal Steps: Normal Total Score: 24       Pertinent Vitals/Pain Pain Assessment: No/denies pain    Home Living Family/patient expects to be discharged to:: Private residence Living Arrangements: Spouse/significant other Available Help at Discharge: Family Type of Home: House Home Access: Stairs to enter   Technical brewer of Steps: 1          Prior Function Level of Independence: Independent         Comments: retired; active, enjoys yard work     Journalist, newspaper   Dominant Hand: Left    Extremity/Trunk Assessment   Upper Extremity Assessment Upper Extremity Assessment: Overall WFL for tasks assessed    Lower Extremity Assessment Lower Extremity Assessment: Overall WFL for tasks assessed    Cervical / Trunk Assessment Cervical / Trunk Assessment: Normal  Communication   Communication: No difficulties  Cognition Arousal/Alertness: Awake/alert Behavior During Therapy: WFL for tasks assessed/performed Overall Cognitive Status: Within Functional Limits for tasks assessed  General Comments      Exercises     Assessment/Plan    PT Assessment Patent does not need any further PT services  PT Problem List         PT Treatment Interventions      PT Goals (Current goals can be found in the Care Plan section)  Acute Rehab PT Goals Patient Stated Goal: return to prior level of function PT Goal Formulation: All assessment and education complete, DC therapy    Frequency     Barriers to discharge         Co-evaluation               AM-PAC PT "6 Clicks" Mobility  Outcome Measure Help needed turning from your back to your side while in a flat bed without using bedrails?: None Help needed moving from lying on your back to sitting on the side of a flat bed without using bedrails?: None Help needed moving to and from a bed to a chair (including a wheelchair)?: None Help needed standing up from a chair using your arms (e.g., wheelchair or bedside chair)?: None Help needed to walk in hospital room?: None Help needed climbing 3-5 steps with a railing? : None 6 Click Score: 24    End of Session   Activity Tolerance: Patient tolerated treatment well Patient left: in chair;with call bell/phone within reach Nurse Communication: Mobility status PT Visit Diagnosis: Other symptoms and signs involving the nervous system (R29.898)    Time: [id number] PT Time Calculation (min) (ACUTE ONLY): 12 min   Charges:   PT Evaluation $PT Eval Low Complexity: 1 Low          Wyona Almas, PT, DPT Acute Rehabilitation Services Pager [phone] Office [phone]   Deno Etienne 03/06/2021, 12:22 PM

## 2021-03-06 NOTE — Progress Notes (Signed)
OT Cancellation Note  Patient Details Name: Francisco Price MRN: [id number] DOB: 11/20/1950   Cancelled Treatment:    Reason Eval/Treat Not Completed: OT screened, no needs identified, will sign off (MRI negative. No symptoms.)  [facility] Maurie Boettcher, OT/L   Acute OT Clinical Specialist Acute Rehabilitation Services Pager [phone] Office [phone]  03/06/2021, 12:20 PM

## 2021-03-06 NOTE — Progress Notes (Signed)
STROKE TEAM PROGRESS NOTE    Patient ID: 70 year old male presented with left-sided numbness and weakness on 12Aug. s/p tPA. Currently asymptomatic and back to baseline per patient and spouse.  SUBJECTIVE  Francisco Price is laying comfortably in bed. He has no complaints. All neurological symptoms have resolved (since yesterday afternoon, per patient). His wife is at the bedside.   At approximately 1130 this morning, Francisco Price reports having blood in his stool. On further questioning he described stool to be coated in bright red blood. He is hemodynamically stable on exam.  He endorses feeling well, without complaints of lightheadedness, dizziness, no visual changes. No history of GI bleed. RBC: 5.31, hemoglobin: 16.7, HCT 47.1.  PERTINENT IMAGING: [place] 12Aug2022: Aspects 10 CTA HEAD/NECK: 12Aug2022: No intracranial large vessel occlusion or intracranial proximal high-grade arterial stenosis identified. The common carotid, internal carotid and vertebral arteries are patent within the neck without stenosis. CTA CHEST/ABD/PELVIS: 12Aug2022: No acute findings in the chest, abdomen, or pelvis. Specifically, no aortic dissection as queried. MR BRAIN [id number]: No acute intracranial abnormality.  Small lacunar infarcts in the right basal ganglia but otherwise largely normal for age.   OBJECTIVE Vitals:   03/06/21 0500 03/06/21 0600 03/06/21 0700 03/06/21 0800  BP: 127/76  137/75 140/88  Pulse: 72 65 66 64  Resp: '16 15 16 '$ (!) 24  Temp:    (!) 97.5 F (36.4 C)  TempSrc:    Oral  SpO2: 92% 92% 91% 94%  Weight:        CBC:  Recent Labs  Lab 03/05/21 1033 03/05/21 1038  WBC 4.7  --   NEUTROABS 3.0  --   HGB 16.2 15.3  HCT 45.7 45.0  MCV 89.3  --   PLT 151  --     Basic Metabolic Panel:  Recent Labs  Lab 03/05/21 1033 03/05/21 1038  NA 140 143  K 3.8 3.8  CL 107 105  CO2 24  --   GLUCOSE 122* 123*  BUN 12 14  CREATININE 1.41* 1.40*  CALCIUM 9.1  --     Lipid Panel:   Recent Labs  Lab 03/06/21 0054  CHOL 163  TRIG 111  HDL 27*  CHOLHDL 6.0  VLDL 22  LDLCALC 114*   HgbA1c:  Lab Results  Component Value Date   HGBA1C 5.3 03/06/2021   Urine Drug Screen:     Component Value Date/Time   LABOPIA NONE DETECTED 03/05/2021 1302   COCAINSCRNUR NONE DETECTED 03/05/2021 1302   LABBENZ NONE DETECTED 03/05/2021 1302   AMPHETMU NONE DETECTED 03/05/2021 1302   THCU NONE DETECTED 03/05/2021 1302   LABBARB NONE DETECTED 03/05/2021 1302    Alcohol Level     Component Value Date/Time   ETH <10 03/05/2021 1033    IMAGING  Results for orders placed or performed during the hospital encounter of 03/05/21  MR BRAIN WO CONTRAST   Narrative   CLINICAL DATA:  70 year old male code stroke presentation with unrevealing CTA. Left side deficits.  EXAM: MRI HEAD WITHOUT CONTRAST  TECHNIQUE: Multiplanar, multiecho pulse sequences of the brain and surrounding structures were obtained without intravenous contrast.  COMPARISON:  CTA Head and plain head CT 03/05/2021.  FINDINGS: Brain: No restricted diffusion to suggest acute infarction. No midline shift, mass effect, evidence of mass lesion, ventriculomegaly, extra-axial collection or acute intracranial hemorrhage. Cervicomedullary junction and pituitary are within normal limits.  Small chronic lacunar infarcts in the right basal ganglia. But elsewhere only mild for age nonspecific white matter  T2 and FLAIR hyperintensity. No cortical encephalomalacia or chronic cerebral blood products identified. The other deep gray nuclei, the brainstem and cerebellum are normal for age.  Vascular: Major intracranial vascular flow voids are preserved.  Skull and upper cervical spine: Negative for age visible cervical spine. Normal bone marrow signal.  Sinuses/Orbits: Negative orbits. Trace paranasal sinus mucosal thickening.  Other: Mastoids are clear. Visible internal auditory structures appear normal.  Negative visible scalp and face.  IMPRESSION: 1. No acute intracranial abnormality. 2. Small chronic lacunar infarcts of the right basal ganglia but otherwise largely normal for age noncontrast MRI appearance of the brain.   Electronically Signed   By: Genevie Ann M.D.   On: 03/06/2021 09:37     ROS:                                                                                                                                       History obtained from the patient  General: Negative for: fatigue, fever, night sweats Psychological: Negative for: Hallucinations, memory difficulties, mood swings  Ophthalmic: Negative for: Blurry vision, double vision ENT: Negative for: vertigo Respiratory: Negative for: Cough, shortness of breath Cardiovascular: Negative for: Chest pain Gastrointestinal: Negative for: Nausea/vomiting; As noted above Musculoskeletal: Negative for: Joint swelling or muscular weakness Neurological: As noted in HPI   General Exam__________________________________________________________   HEENT-  Normocephalic, no lesions, without obvious abnormality.  Normal external eye and conjunctiva.   Cardiovascular- RRR, on tele, well-perfused  Lungs-Breathing comfortably on room air Musculoskeletal-no joint tenderness, deformity or swelling Skin-Visible skin warm and dry, no hyperpigmentation, vitiligo, or suspicious lesions  Neurologic Exam:_______________________________________________________ General: NAD Mental Status: Alert, oriented, thought content appropriate.  Speech fluent without evidence of aphasia.  Able to follow 3 step commands without difficulty. Cranial Nerves: II:  Visual fields grossly normal, pupils equal, round and reactive to light III,IV, VI: Ptosis not present, extra-ocular motions intact bilaterally V,VII: Smile symmetric, facial light touch sensation normal bilaterally VIII: Hearing normal bilaterally IX,X: Uvula rises symmetrically XI: Shoulder  shrug with equal strength bilaterally XII: Midline tongue extension without atrophy or fasciculations  Motor: Right : Upper extremity   5/5    Left:     Upper extremity   5/5  Lower extremity   5/5     Lower extremity   5/5 Tone and Bulk: Normal throughout; no atrophy noted Sensory: Pinprick and light touch intact throughout, equal bilaterally Deep Tendon Reflexes:  Right: Upper Extremity:   Biceps (C-5 to C-6) 2/4       Brachioradialis (C6) 1/4   Left Upper Extremity: Biceps (C-5 to C-6) 2/4 Brachioradialis (C6) 1/4  Lower Extremities: Quadriceps (L-2 to L-4) 1/4 bilaterally   Plantars: Downgoing bilaterally Cerebellar: FTN without evidence of ataxia or dysmetria, HTS without evidence of ataxia Gait: Deferred  ASSESSMENT/PLAN Mr. LANZ MOR is a 70 y.o. male with history of hypercholesteremia, BPH, degenerative disc disease, and  an umbilical hernia who presented to the ED 03/05/2021 for evaluation of left-sided numbness while mowing the lawn.  NIH stroke scale of 1. status post tPA.  His symptoms have resolved both according to the patient and the patient's wife.  He has since been moved to stepdown.  Around 1130 this morning, Francisco Price did have 1 episode of bloody stools which was described as blood coating the stool.  At that time he was hemodynamically stable and completely asymptomatic. CBC this afternoon was normal. We will continue to follow CBC and presentation.   Code stroke, status post tPA; Symptoms have completely resolved  CT head: As above MRI head: As above CTA head/neck: As above 2D Echo significant for mild LVH, and mild dilation of ascending aorta. LDL 114, goal under 70.  Start Lipitor 80. HgbA1c 5.3, at goal under 7 SCDs for VTE prophylaxis Diet Order             Diet Carb Modified Fluid consistency: Thin; Room service appropriate? Yes  Diet effective now                 aspirin 81 mg daily prior to admission; hold anticoagulation today;  reconsider tomorrow if stable from a GI bleed standpoint. Ongoing aggressive stroke risk factor management Therapy recommendations: No PT follow-up needed Disposition: To be determined when medically appropriate  Hypertension Permissive hypertension (OK if < 220/120) but gradually normalize in 5-7 days Long-term BP goal normotensive  Hyperlipidemia Appears untreated at home LDL 114, goal < 70 Add Lipitor 80 Continue statin at discharge  Hematochezia Encouraged patient not to flush before provider could visualize stool. Follow CBC  Hospital day # 1  Solon Augusta, PhD, PA-C Neurology, Stroke Team  To contact Stroke Continuity provider, please refer to [email or web address] After hours, contact General Neurology

## 2021-03-06 NOTE — Progress Notes (Signed)
Pt reports blood in his stool after his bowel movement. Dr. Reeves Forth notified and CBC ordered. Dr. Reeves Forth at [facility] to evaluate.

## 2021-03-07 MED ORDER — ASPIRIN 325 MG PO TABS: 325.0000 mg | ORAL_TABLET | Freq: Every day | ORAL | Status: DC

## 2021-03-07 MED ORDER — STROKE: EARLY STAGES OF RECOVERY BOOK
Status: AC
  Filled 2021-03-07: qty 1

## 2021-03-07 MED ORDER — ATORVASTATIN CALCIUM 80 MG PO TABS: 80.0000 mg | ORAL_TABLET | Freq: Every day | ORAL | 0 refills | Status: DC

## 2021-03-07 MED ORDER — PANTOPRAZOLE SODIUM 40 MG PO TBEC: 40.0000 mg | DELAYED_RELEASE_TABLET | Freq: Every day | ORAL | 0 refills | Status: DC

## 2021-03-07 MED ORDER — ASPIRIN 325 MG PO TABS: 325.0000 mg | ORAL_TABLET | Freq: Every day | ORAL | 0 refills | Status: DC

## 2021-03-07 NOTE — Discharge Summary (Addendum)
Discharge date: 03/07/2021     Patient ID: 70 year old male presented with left-sided numbness and weakness on 12Aug. s/p tPA. Currently asymptomatic and back to baseline per patient and spouse.  SUBJECTIVE  Francisco Price is walking around his room. All neurological symptoms have resolved. He has two more BM with no blood. No dizziness. Feels great, ready to go home.   PERTINENT IMAGING: [place] 12Aug2022: Aspects 10 CTA HEAD/NECK: 12Aug2022: No intracranial large vessel occlusion or intracranial proximal high-grade arterial stenosis identified. The common carotid, internal carotid and vertebral arteries are patent within the neck without stenosis. CTA CHEST/ABD/PELVIS: 12Aug2022: No acute findings in the chest, abdomen, or pelvis. Specifically, no aortic dissection as queried. MR BRAIN [id number]: No acute intracranial abnormality.  Small lacunar infarcts in the right basal ganglia but otherwise largely normal for age.   OBJECTIVE Vitals:   03/07/21 0200 03/07/21 0405 03/07/21 0823 03/07/21 1204  BP: 114/66 138/87 139/72 (!) 132/92  Pulse: (!) 57 (!) 59 60 66  Resp: '15 18 18 18  '$ Temp:  97.8 F (36.6 C) 98 F (36.7 C) 98 F (36.7 C)  TempSrc:  Oral Oral   SpO2: 93% 99% 97% 100%  Weight:        CBC:  Recent Labs  Lab 03/05/21 1033 03/05/21 1038 03/06/21 0054 03/06/21 1536  WBC 4.7  --  4.7 5.1  NEUTROABS 3.0  --   --   --   HGB 16.2   < > 14.9 16.7  HCT 45.7   < > 43.0 47.1  MCV 89.3  --  90.3 88.7  PLT 151  --  143* 161   < > = values in this interval not displayed.    Basic Metabolic Panel:  Recent Labs  Lab 03/05/21 1033 03/05/21 1038  NA 140 143  K 3.8 3.8  CL 107 105  CO2 24  --   GLUCOSE 122* 123*  BUN 12 14  CREATININE 1.41* 1.40*  CALCIUM 9.1  --     Lipid Panel:  Recent Labs  Lab 03/06/21 0054  CHOL 163  TRIG 111  HDL 27*  CHOLHDL 6.0  VLDL 22  LDLCALC 114*   HgbA1c:  Lab Results  Component Value Date   HGBA1C 5.3 03/06/2021   Urine Drug  Screen:     Component Value Date/Time   LABOPIA NONE DETECTED 03/05/2021 1302   COCAINSCRNUR NONE DETECTED 03/05/2021 1302   LABBENZ NONE DETECTED 03/05/2021 1302   AMPHETMU NONE DETECTED 03/05/2021 1302   THCU NONE DETECTED 03/05/2021 1302   LABBARB NONE DETECTED 03/05/2021 1302    Alcohol Level     Component Value Date/Time   ETH <10 03/05/2021 1033    IMAGING  Results for orders placed or performed during the hospital encounter of 03/05/21  MR BRAIN WO CONTRAST   Narrative   CLINICAL DATA:  70 year old male code stroke presentation with unrevealing CTA. Left side deficits.  EXAM: MRI HEAD WITHOUT CONTRAST  TECHNIQUE: Multiplanar, multiecho pulse sequences of the brain and surrounding structures were obtained without intravenous contrast.  COMPARISON:  CTA Head and plain head CT 03/05/2021.  FINDINGS: Brain: No restricted diffusion to suggest acute infarction. No midline shift, mass effect, evidence of mass lesion, ventriculomegaly, extra-axial collection or acute intracranial hemorrhage. Cervicomedullary junction and pituitary are within normal limits.  Small chronic lacunar infarcts in the right basal ganglia. But elsewhere only mild for age nonspecific white matter T2 and FLAIR hyperintensity. No cortical encephalomalacia or chronic cerebral blood products  identified. The other deep gray nuclei, the brainstem and cerebellum are normal for age.  Vascular: Major intracranial vascular flow voids are preserved.  Skull and upper cervical spine: Negative for age visible cervical spine. Normal bone marrow signal.  Sinuses/Orbits: Negative orbits. Trace paranasal sinus mucosal thickening.  Other: Mastoids are clear. Visible internal auditory structures appear normal. Negative visible scalp and face.  IMPRESSION: 1. No acute intracranial abnormality. 2. Small chronic lacunar infarcts of the right basal ganglia but otherwise largely normal for age noncontrast  MRI appearance of the brain.   Electronically Signed   By: Genevie Ann M.D.   On: 03/06/2021 09:37     ROS:                                                                                                                                       History obtained from the patient  General: Negative for: fatigue, fever, night sweats Psychological: Negative for: Hallucinations, memory difficulties, mood swings  Ophthalmic: Negative for: Blurry vision, double vision ENT: Negative for: vertigo Respiratory: Negative for: Cough, shortness of breath Cardiovascular: Negative for: Chest pain Gastrointestinal: Negative for: Nausea/vomiting; As noted above Musculoskeletal: Negative for: Joint swelling or muscular weakness Neurological: As noted in HPI   General Exam__________________________________________________________   HEENT-  Normocephalic, no lesions, without obvious abnormality.  Normal external eye and conjunctiva.   Cardiovascular- RRR, on tele, well-perfused  Lungs-Breathing comfortably on room air Musculoskeletal-no joint tenderness, deformity or swelling Skin-Visible skin warm and dry, no hyperpigmentation, vitiligo, or suspicious lesions  Neurologic Exam:_______________________________________________________ General: NAD Mental Status: Alert, oriented, thought content appropriate.  Speech fluent without evidence of aphasia.  Able to follow 3 step commands without difficulty. Cranial Nerves: II:  Visual fields grossly normal, pupils equal, round and reactive to light III,IV, VI: Ptosis not present, extra-ocular motions intact bilaterally V,VII: Smile symmetric, facial light touch sensation normal bilaterally VIII: Hearing normal bilaterally IX,X: Uvula rises symmetrically XI: Shoulder shrug with equal strength bilaterally XII: Midline tongue extension without atrophy or fasciculations  Motor: Right : Upper extremity   5/5    Left:     Upper extremity   5/5  Lower extremity    5/5     Lower extremity   5/5 Tone and Bulk: Normal throughout; no atrophy noted Sensory: Pinprick and light touch intact throughout, equal bilaterally Deep Tendon Reflexes:  Right: Upper Extremity:   Biceps (C-5 to C-6) 2/4       Brachioradialis (C6) 1/4   Left Upper Extremity: Biceps (C-5 to C-6) 2/4 Brachioradialis (C6) 1/4  Lower Extremities: Quadriceps (L-2 to L-4) 1/4 bilaterally   Plantars: Downgoing bilaterally Cerebellar: FTN without evidence of ataxia or dysmetria, HTS without evidence of ataxia Gait: Deferred  NIHSS: 0  ASSESSMENT/PLAN Francisco Price is a 70 y.o. male with history of hypercholesteremia, BPH, degenerative disc disease, and an umbilical hernia who presented to the ED 03/05/2021  for evaluation of left-sided numbness while mowing the lawn.  NIH stroke scale of 1. status post tPA.    No Further bloody BM. CBC is stable.  Code stroke, status post tPA; Symptoms have completely resolved  CT head: As above MRI head: As above CTA head/neck: As above 2D Echo significant for mild LVH, and mild dilation of ascending aorta. LDL 114, goal under 70.  Start Lipitor 80. HgbA1c 5.3, at goal under 7 SCDs for VTE prophylaxis Diet Order             Diet Carb Modified Fluid consistency: Thin; Room service appropriate? Yes  Diet effective now                 Aspirin restarted at '325mg'$  po daily. He will monitor for any further bleeding and contact his doctor or come to the ER. Ongoing aggressive stroke risk factor management Therapy recommendations: No PT follow-up needed Disposition: Home. F/u in stroke clinic.  Hypertension Permissive hypertension (OK if < 220/120) but gradually normalize in 5-7 days Long-term BP goal normotensive  Hyperlipidemia Appears untreated at home LDL 114, goal < 70 Add Lipitor 80 Continue statin at discharge  Hematochezia Limited and now fully resolved.  Discharge to home today. Hospital day # 2  Egbert Garibaldi,  [facility] Neurology, Stroke Team   Total of 30 mins spent reviewing chart, [place] instructions, discussion with patient and family on prognosis, Dx and plan. Discussed case with patient's nurse. Reviewed Imaging personally.   To contact Stroke Continuity provider, please refer to [email or web address] After hours, contact General Neurology

## 2021-03-07 NOTE — Progress Notes (Signed)
Pt medically stable for discharge per MD order. Tele and IV removed. Belongings: cell phone, charger, glasses, clothing, shoes. Discharge paperwork reviewed with patient and family, all questions answered. Patient leaving for home.

## 2021-03-07 NOTE — Plan of Care (Signed)
  Problem: Education: Goal: Knowledge of disease or condition will improve Outcome: Adequate for Discharge Goal: Knowledge of secondary prevention will improve Outcome: Adequate for Discharge Goal: Knowledge of patient specific risk factors addressed and post discharge goals established will improve Outcome: Adequate for Discharge   Problem: Coping: Goal: Will verbalize positive feelings about self Outcome: Adequate for Discharge Goal: Will identify appropriate support needs Outcome: Adequate for Discharge   Problem: Health Behavior/Discharge Planning: Goal: Ability to manage health-related needs will improve Outcome: Adequate for Discharge   Problem: Self-Care: Goal: Ability to participate in self-care as condition permits will improve Outcome: Adequate for Discharge Goal: Verbalization of feelings and concerns over difficulty with self-care will improve Outcome: Adequate for Discharge Goal: Ability to communicate needs accurately will improve Outcome: Adequate for Discharge   Problem: Nutrition: Goal: Risk of aspiration will decrease Outcome: Adequate for Discharge   Problem: Ischemic Stroke/TIA Tissue Perfusion: Goal: Complications of ischemic stroke/TIA will be minimized Outcome: Adequate for Discharge

## 2021-03-07 NOTE — Progress Notes (Signed)
OT Cancellation Note  Patient Details Name: JONANTHONY LIBERTINI MRN: [id number] DOB: Nov 23, 1950   Cancelled Treatment:    Reason Eval/Treat Not Completed: OT screened, no needs identified, will sign off (Per physical therapy, there is no skilled OT need at this time.)   Jefferey Pica, OTR/[place] Pager: [phone] Office: [place] 03/07/2021, 8:16 AM

## 2021-03-16 ENCOUNTER — Ambulatory Visit (INDEPENDENT_AMBULATORY_CARE_PROVIDER_SITE_OTHER): Payer: Managed Care, Other (non HMO) | Admitting: Neurology

## 2021-03-16 ENCOUNTER — Encounter: Payer: Self-pay | Admitting: Neurology

## 2021-03-16 ENCOUNTER — Inpatient Hospital Stay: Payer: Medicare Other | Admitting: Neurology

## 2021-03-16 VITALS — BP 134/82 | HR 72 | Ht 74.0 in | Wt 216.0 lb

## 2021-03-16 DIAGNOSIS — E7849 Other hyperlipidemia: Secondary | ICD-10-CM | POA: Diagnosis not present

## 2021-03-16 DIAGNOSIS — R200 Anesthesia of skin: Secondary | ICD-10-CM | POA: Diagnosis not present

## 2021-03-16 DIAGNOSIS — R202 Paresthesia of skin: Secondary | ICD-10-CM

## 2021-03-16 DIAGNOSIS — R2990 Unspecified symptoms and signs involving the nervous system: Secondary | ICD-10-CM

## 2021-03-16 MED ORDER — CLOPIDOGREL BISULFATE 75 MG PO TABS: 75.0000 mg | ORAL_TABLET | Freq: Every day | ORAL | 11 refills | Status: DC

## 2021-03-16 NOTE — Patient Instructions (Signed)
I had a long d/w patient about his recent stroke like episode, risk for recurrent stroke/TIAs, personally independently reviewed imaging studies and stroke evaluation results and answered questions.recommend he discontinue aspirin and start taking Plavix 75 mg daily   for secondary stroke prevention and maintain strict control of hypertension with blood pressure goal below 130/90, diabetes with hemoglobin A1c goal below 6.5% and lipids with LDL cholesterol goal below 70 mg/dL. I also advised the patient to eat a healthy diet with plenty of whole grains, cereals, fruits and vegetables, exercise regularly and maintain ideal body weight Followup in the future with my nurse practitioner Janett Billow in 3 months or call earlier if necessary. Stroke Prevention Some medical conditions and behaviors are associated with a higher chance of having a stroke. You can help prevent a stroke by making nutrition, lifestyle,and other changes, including managing any medical conditions you may have. What nutrition changes can be made?  Eat healthy foods. You can do this by: Choosing foods high in fiber, such as fresh fruits and vegetables and whole grains. Eating at least 5 or more servings of fruits and vegetables a day. Try to fill half of your plate at each meal with fruits and vegetables. Choosing lean protein foods, such as lean cuts of meat, poultry without skin, fish, tofu, beans, and nuts. Eating low-fat dairy products. Avoiding foods that are high in salt (sodium). This can help lower blood pressure. Avoiding foods that have saturated fat, trans fat, and cholesterol. This can help prevent high cholesterol. Avoiding processed and premade foods. Follow your health care provider's specific guidelines for losing weight, controlling high blood pressure (hypertension), lowering high cholesterol, and managing diabetes. These may include: Reducing your daily calorie intake. Limiting your daily sodium intake to 1,500  milligrams (mg). Using only healthy fats for cooking, such as olive oil, canola oil, or sunflower oil. Counting your daily carbohydrate intake. What lifestyle changes can be made? Maintain a healthy weight. Talk to your health care provider about your ideal weight. Get at least 30 minutes of moderate physical activity at least 5 days a week. Moderate activity includes brisk walking, biking, and swimming. Do not use any products that contain nicotine or tobacco, such as cigarettes and e-cigarettes. If you need help quitting, ask your health care provider. It may also be helpful to avoid exposure to secondhand smoke. Limit alcohol intake to no more than 1 drink a day for nonpregnant women and 2 drinks a day for men. One drink equals 12 oz of beer, 5 oz of wine, or 1 oz of hard liquor. Stop any illegal drug use. Avoid taking birth control pills. Talk to your health care provider about the risks of taking birth control pills if: You are over 93 years old. You smoke. You get migraines. You have ever had a blood clot. What other changes can be made? Manage your cholesterol levels. Eating a healthy diet is important for preventing high cholesterol. If cholesterol cannot be managed through diet alone, you may also need to take medicines. Take any prescribed medicines to control your cholesterol as told by your health care provider. Manage your diabetes. Eating a healthy diet and exercising regularly are important parts of managing your blood sugar. If your blood sugar cannot be managed through diet and exercise, you may need to take medicines. Take any prescribed medicines to control your diabetes as told by your health care provider. Control your hypertension. To reduce your risk of stroke, try to keep your blood pressure below  130/80. Eating a healthy diet and exercising regularly are an important part of controlling your blood pressure. If your blood pressure cannot be managed through diet and  exercise, you may need to take medicines. Take any prescribed medicines to control hypertension as told by your health care provider. Ask your health care provider if you should monitor your blood pressure at home. Have your blood pressure checked every year, even if your blood pressure is normal. Blood pressure increases with age and some medical conditions. Get evaluated for sleep disorders (sleep apnea). Talk to your health care provider about getting a sleep evaluation if you snore a lot or have excessive sleepiness. Take over-the-counter and prescription medicines only as told by your health care provider. Aspirin or blood thinners (antiplatelets or anticoagulants) may be recommended to reduce your risk of forming blood clots that can lead to stroke. Make sure that any other medical conditions you have, such as atrial fibrillation or atherosclerosis, are managed. What are the warning signs of a stroke? The warning signs of a stroke can be easily remembered as BEFAST. B is for balance. Signs include: Dizziness. Loss of balance or coordination. Sudden trouble walking. E is for eyes. Signs include: A sudden change in vision. Trouble seeing. F is for face. Signs include: Sudden weakness or numbness of the face. The face or eyelid drooping to one side. A is for arms. Signs include: Sudden weakness or numbness of the arm, usually on one side of the body. S is for speech. Signs include: Trouble speaking (aphasia). Trouble understanding. T is for time. These symptoms may represent a serious problem that is an emergency. Do not wait to see if the symptoms will go away. Get medical help right away. Call your local emergency services (911 in the [place] Do not drive yourself to the hospital. Other signs of stroke may include: A sudden, severe headache with no known cause. Nausea or vomiting. Seizure. Where to find more information For more information, visit: American Stroke Association:  www.strokeassociation.org National Stroke Association: www.stroke.org Summary You can prevent a stroke by eating healthy, exercising, not smoking, limiting alcohol intake, and managing any medical conditions you may have. Do not use any products that contain nicotine or tobacco, such as cigarettes and e-cigarettes. If you need help quitting, ask your health care provider. It may also be helpful to avoid exposure to secondhand smoke. Remember BEFAST for warning signs of stroke. Get help right away if you or a loved one has any of these signs. This information is not intended to replace advice given to you by your health care provider. Make sure you discuss any questions you have with your healthcare provider. Document Revised: 06/23/2017 Document Reviewed: 08/16/2016 Elsevier Patient Education  2021 [employer].

## 2021-03-16 NOTE — Progress Notes (Signed)
[facility] [address] [place]. [place] [address] [phone]       OFFICE CONSULT NOTE  [name] Date of Birth:  06/24/1951 Medical Record Number:  [id number]   Referring MD: Alex Gardener  Reason for Referral: Stroke  HPI: [name] is a 70 year old Caucasian male seen today for initial office consultation visit for stroke.  History is obtained from the patient and review of electronic medical records and I have reviewed pertinent imaging films in PACS.  He has past medical history of hyperlipidemia, benign prostatic hypertrophy, umbilical hernia and colonic polyps.  He presented on 03/05/2021 with sudden onset of left-sided numbness.  He was on his lawnmower mowing his lawn when he developed sudden onset lower left chest discomfort and left facial numbness that extended down his left arm into his left hand and then subsequently few minutes later into his left leg as well.  He denied any weakness, slurred speech, headache dizziness or vision symptoms.  When evaluated by neuro hospitalist he had NIH stroke scale of 1.  After discussion of risk benefits IV tPA was given uneventfully and patient noted complete resolution of his symptoms.  CT scan of the head was unremarkable and MRI scan showed no acute stroke but showed old right basal ganglia lacunar stroke of remote age.  CT angiogram of brain and neck showed no significant large vessel extracranial intracranial stenosis.  Due to his chest pain and concern for dissection CT scan of the chest abdomen pelvis was also obtained which was unremarkable.  Transthoracic echo showed normal ejection fraction without cardiac source of embolism.  LDL cholesterol was elevated at '1 1 4 '$ mg percent and hemoglobin A1c was 5.3.  Patient was discharged home and has done well and has had no recurrent stroke or TIA symptoms.  He denies any prior history of migraine headaches complicated migraine no seizure.  There is no family  history of stroke.  He denies smoking cigarettes and uses alcohol moderate amount.  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   Left-handed.   Two cans soda per day.   Social Determinants of Health   Financial Resource Strain: Not on file  Food Insecurity: Not on file  Transportation Needs: Not on file  Physical Activity: Not on file  Stress: Not on file  Social Connections: Not on file  Intimate Partner Violence: Not on file    Medications:   Current Outpatient Medications on File Prior to Visit  Medication Sig Dispense Refill   atorvastatin (LIPITOR) 80 MG tablet Take 1 tablet (80 mg total) by mouth daily. 90 tablet 0   loratadine (CLARITIN) 10 MG tablet Take 10 mg by mouth daily as needed for allergies.     tadalafil (CIALIS) 10 MG tablet TAKE 1 TABLET BY MOUTH ONCE DAILY - (Patient taking differently: Take 10 mg by mouth daily. Continuous) 90 tablet 3   tamsulosin (FLOMAX) 0.4 MG CAPS capsule Take 1 capsule by mouth once daily 90 capsule 0   No current facility-administered medications on file prior to visit.    Allergies:    Allergies  Allergen Reactions   Niacin     REACTION: intol w/ flushing    Physical Exam General: well developed, well nourished, pleasant elderly Caucasian male seated, in no evident distress Head: head normocephalic and atraumatic.   Neck: supple with no carotid or supraclavicular bruits Cardiovascular: regular rate and rhythm, no murmurs Musculoskeletal: no deformity Skin:  no rash/petichiae Vascular:  Normal pulses all extremities  Neurologic Exam Mental Status: Awake and fully alert. Oriented to place and time. Recent and remote memory intact. Attention span, concentration and fund of knowledge appropriate. Mood and affect appropriate.  Cranial Nerves: Fundoscopic exam reveals sharp disc margins. Pupils equal, briskly reactive to light. Extraocular movements full without nystagmus. Visual fields full to confrontation. Hearing intact. Facial sensation intact. Face, tongue, palate moves normally and symmetrically.  Motor: Normal bulk and tone. Normal strength in all tested extremity muscles. Sensory.: intact to touch , pinprick , position and vibratory sensation.  Coordination: Rapid alternating movements normal in all extremities. Finger-to-nose and heel-to-shin performed accurately bilaterally. Gait and Station: Arises from chair without difficulty. Stance is normal. Gait demonstrates normal stride length and balance . Able to heel, toe and tandem walk without difficulty.  Reflexes: 1+ and symmetric. Toes downgoing.   NIHSS  0 Modified Rankin  0   ASSESSMENT: 70 year old Caucasian male with episode of transient fleeting paresthesias in the left arm and leg likely strokelike episode in August 2022 treated with IV tPA with full recovery.  MRI scan shows an old silent right basal ganglia lacunar stroke.  Vascular risk factors of hyperlipidemia and silent cerebrovascular disease.     PLAN: I had a long d/w patient about his recent stroke like episode, risk for recurrent  stroke/TIAs, personally independently reviewed imaging studies and stroke evaluation results and answered questions.recommend he discontinue aspirin and start taking Plavix 75 mg daily   for secondary stroke prevention and maintain strict control of hypertension with blood pressure goal below 130/90, diabetes with hemoglobin A1c goal below 6.5% and lipids with LDL cholesterol goal below 70 mg/dL. I also advised the patient to eat a healthy diet with plenty of whole grains, cereals, fruits and vegetables, exercise regularly and maintain ideal body weight Followup in the future with my nurse practitioner Janett Billow in 3 months or call earlier if necessary.  Greater than 50% time during this 45-minute consultation visit was spent on counseling and coordination of care about his strokelike episode and paresthesias and answering questions  Antony Contras, MD  Note: This document was prepared with digital dictation and possible smart phrase technology. Any transcriptional errors that result from this process are unintentional.

## 2021-06-03 ENCOUNTER — Encounter: Payer: Self-pay | Admitting: *Deleted

## 2021-06-08 ENCOUNTER — Encounter: Payer: Self-pay | Admitting: Internal Medicine

## 2021-06-09 ENCOUNTER — Encounter: Payer: Self-pay | Admitting: *Deleted

## 2021-06-09 MED ORDER — ATORVASTATIN CALCIUM 80 MG PO TABS: 80.0000 mg | ORAL_TABLET | Freq: Every day | ORAL | 3 refills | Status: DC

## 2021-06-10 ENCOUNTER — Encounter: Payer: Self-pay | Admitting: *Deleted

## 2021-06-14 ENCOUNTER — Other Ambulatory Visit: Payer: Self-pay

## 2021-06-14 NOTE — Patient Outreach (Signed)
[facility]) Care Management  06/14/2021  OTHELLO DICKENSON Jun 13, 1951 [id number]   First telephone outreach attempt to obtain mRS. No answer. Left message for returned call.  Philmore Pali [facility] Management Assistant [phone]

## 2021-06-15 ENCOUNTER — Encounter: Payer: Self-pay | Admitting: Adult Health

## 2021-06-15 ENCOUNTER — Ambulatory Visit (INDEPENDENT_AMBULATORY_CARE_PROVIDER_SITE_OTHER): Payer: Managed Care, Other (non HMO) | Admitting: Adult Health

## 2021-06-15 ENCOUNTER — Other Ambulatory Visit: Payer: Self-pay

## 2021-06-15 VITALS — BP 147/87 | HR 78 | Ht 74.0 in | Wt 215.0 lb

## 2021-06-15 DIAGNOSIS — E785 Hyperlipidemia, unspecified: Secondary | ICD-10-CM | POA: Diagnosis not present

## 2021-06-15 DIAGNOSIS — R2990 Unspecified symptoms and signs involving the nervous system: Secondary | ICD-10-CM | POA: Diagnosis not present

## 2021-06-15 MED ORDER — ASPIRIN EC 325 MG PO TBEC: 325.0000 mg | DELAYED_RELEASE_TABLET | Freq: Every day | ORAL | 11 refills | Status: AC

## 2021-06-15 NOTE — Patient Instructions (Signed)
Stop Plavix and start aspirin 325 mg daily (due to interaction with Plavix and Nexium) and continue atorvastatin for secondary stroke prevention  Continue to follow up with PCP regarding cholesterol management  Maintain strict control of cholesterol with LDL cholesterol (bad cholesterol) goal below 70 mg/dL.     Please continue to follow with your PCP for continued aggressive stroke risk factor management and monitoring     Thank you for coming to see [place] at [facility]. I hope we have been able to provide you high quality care today.  You may receive a patient satisfaction survey over the next few weeks. We would appreciate your feedback and comments so that we may continue to improve ourselves and the health of our patients.

## 2021-06-15 NOTE — Progress Notes (Signed)
[facility] [address] [place]. [place] [address] [phone]       OFFICE FOLLOW UP NOTE  [name] Date of Birth:  Sep 24, 1950 Medical Record Number:  [id number]   Primary neurologist: Dr. Leonie Price Referring MD: Francisco Price  Reason for Referral: Stroke  Chief Complaint  Patient presents with   Follow-up    Rm 3 alone Pt is well and stable, no complaints. Tingling/numbness resided      HPI:   Update 06/15/2021 JM: Returns for 71-month stroke follow-up unaccompanied.  Overall stable.  Denies new or reoccurring stroke/TIA symptoms.  Compliant on Plavix and atorvastatin -denies side effects. He does report occasional gum bleeding - has f/u with dentist next week. He did need to restart Nexium as Protonix was not beneficial for him and was having severe GERD.  Blood pressure today 147/87.  Does not routinely monitor at home.  No concerns at this time.      History provided for reference purposes only Consult visit 03/16/2021 Dr. Leonie Price: Francisco Price is a 70 year old Caucasian male seen today for initial office consultation visit for stroke.  History is obtained from the patient and review of electronic medical records and I have reviewed pertinent imaging films in PACS.  He has past medical history of hyperlipidemia, benign prostatic hypertrophy, umbilical hernia and colonic polyps.  He presented on 03/05/2021 with sudden onset of left-sided numbness.  He was on his lawnmower mowing his lawn when he developed sudden onset lower left chest discomfort and left facial numbness that extended down his left arm into his left hand and then subsequently few minutes later into his left leg as well.  He denied any weakness, slurred speech, headache dizziness or vision symptoms.  When evaluated by neuro hospitalist he had NIH stroke scale of 1.  After discussion of risk benefits IV tPA was given uneventfully and patient noted complete resolution of his symptoms.  CT  scan of the head was unremarkable and MRI scan showed no acute stroke but showed old right basal ganglia lacunar stroke of remote age.  CT angiogram of brain and neck showed no significant large vessel extracranial intracranial stenosis.  Due to his chest pain and concern for dissection CT scan of the chest abdomen pelvis was also obtained which was unremarkable.  Transthoracic echo showed normal ejection fraction without cardiac source of embolism.  LDL cholesterol was elevated at 1 1 4  mg percent and hemoglobin A1c was 5.3.  Patient was discharged home and has done well and has had no recurrent stroke or TIA symptoms.  He denies any prior history of migraine headaches complicated migraine no seizure.  There is no family history of stroke.  He denies smoking cigarettes and uses alcohol moderate amount.  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   Left-handed.   Two cans soda per day.   Social Determinants of Health   Financial Resource Strain: Not on file  Food Insecurity: Not on file  Transportation Needs: Not on file  Physical Activity: Not on  file  Stress: Not on file  Social Connections: Not on file  Intimate Partner Violence: Not on file    Medications:   Current Outpatient Medications on File Prior to Visit  Medication Sig Dispense Refill   atorvastatin (LIPITOR) 80 MG tablet Take 1 tablet (80 mg total) by mouth daily. 90 tablet 3   clopidogrel (PLAVIX) 75 MG tablet Take 1 tablet (75 mg total) by mouth daily. 30 tablet 11   esomeprazole (NEXIUM) 20 MG capsule      loratadine (CLARITIN) 10 MG tablet Take 10 mg by mouth daily as needed for allergies.     tadalafil (CIALIS) 10 MG tablet TAKE 1 TABLET BY MOUTH ONCE DAILY - (Patient taking differently: Take 10 mg by mouth daily. Continuous) 90 tablet 3   tamsulosin (FLOMAX) 0.4 MG CAPS capsule Take 1 capsule by mouth once daily 90 capsule 0   No current facility-administered medications on file prior to visit.    Allergies:   Allergies  Allergen Reactions   Niacin     REACTION: intol w/ flushing    Physical Exam Today's Vitals   06/15/21 1513  BP: (!) 147/87  Pulse: 78  Weight: 215 lb (97.5 kg)  Height: 6\' 2"  (1.88 m)   Body mass index is 27.6 kg/m.   General: well developed, well nourished, very pleasant elderly Caucasian male seated, in no evident distress Head: head normocephalic and atraumatic.   Neck: supple with no carotid or supraclavicular bruits Cardiovascular: regular rate and rhythm, no murmurs Musculoskeletal: no deformity Skin:  no rash/petichiae Vascular:  Normal pulses all extremities  Neurologic Exam Mental Status: Awake and fully alert.  Fluent speech and language.  Oriented to place and time. Recent and remote memory intact. Attention span, concentration and fund of knowledge appropriate. Mood and affect appropriate.  Cranial Nerves:  Pupils equal, briskly reactive to light. Extraocular movements full without nystagmus. Visual fields full to confrontation. Hearing intact. Facial sensation intact. Face, tongue, palate moves normally and  symmetrically.  Motor: Normal bulk and tone. Normal strength in all tested extremity muscles. Sensory.: intact to touch , pinprick , position and vibratory sensation.  Coordination: Rapid alternating movements normal in all extremities. Finger-to-nose and heel-to-shin performed accurately bilaterally. Gait and Station: Arises from chair without difficulty. Stance is normal. Gait demonstrates normal stride length and balance . Able to heel, toe and tandem walk without difficulty.  Reflexes: 1+ and symmetric. Toes downgoing.       ASSESSMENT/PLAN: 70 year old Caucasian male with episode of transient fleeting paresthesias in the left arm and leg likely strokelike episode in August 2022 treated with IV tPA with full recovery.  MRI scan shows an old silent right basal ganglia lacunar stroke.  Vascular risk factors of hyperlipidemia and silent cerebrovascular disease.     Advised to discontinue Plavix and start aspirin 325 mg daily due to interaction between Plavix and Nexium (and no benefit on Protonix) Continue atorvastatin  80 mg daily for secondary stroke prevention maintain strict control of hypertension with blood pressure goal below 130/90, diabetes with hemoglobin A1c goal below 6.5% and lipids with LDL cholesterol goal below 70 mg/dL.  Also discussed secondary stroke prevention measures such as maintaining a healthy diet with plenty of whole grains, cereals, fruits and vegetables, exercise regularly and maintain ideal body weight    Stable from stroke standpoint routinely followed by PCP - may f/u as needed    CC:  Hoyt Koch, MD   I spent 24 minutes of face-to-face and non-face-to-face time with patient.  This included previsit chart review, lab review, study review, order entry, electronic health record documentation, patient education and discussion regarding prior strokelike episode and history of prior stroke with continued aggressive stroke risk factor management and  secondary stroke prevention measures, use of Nexium and Plavixand answered all other questions to patient satisfaction  Frann Rider, AGNP-BC  [facility] [address] [place], [place] [address]  Phone [phone] Fax [phone] Note: This document was prepared with digital dictation and possible smart phrase technology. Any transcriptional errors that result from this process are unintentional.

## 2021-06-16 ENCOUNTER — Other Ambulatory Visit: Payer: Self-pay

## 2021-06-25 ENCOUNTER — Other Ambulatory Visit: Payer: Self-pay

## 2021-06-25 NOTE — Patient Outreach (Signed)
[place] [facility] Care Management  06/25/2021  Francisco Price 1950/08/26 [id number]   Telephone outreach to patient to obtain mRS was successfully completed. MRS= 7  on 06/16/21    [place] Care Management Assistant

## 2021-11-09 ENCOUNTER — Ambulatory Visit: Payer: Managed Care, Other (non HMO) | Admitting: Internal Medicine

## 2021-11-09 ENCOUNTER — Encounter: Payer: Self-pay | Admitting: Internal Medicine

## 2021-11-09 DIAGNOSIS — F4320 Adjustment disorder, unspecified: Secondary | ICD-10-CM | POA: Insufficient documentation

## 2021-11-09 DIAGNOSIS — F4322 Adjustment disorder with anxiety: Secondary | ICD-10-CM | POA: Diagnosis not present

## 2021-11-09 MED ORDER — TRAZODONE HCL 50 MG PO TABS: 50.0000 mg | ORAL_TABLET | Freq: Every day | ORAL | 3 refills | Status: DC

## 2021-11-09 NOTE — Assessment & Plan Note (Signed)
Rx trazodone 50 mg qhs to help with sleep and anxiety. Adjust as needed. Counseled about coping skills, meditation.  ?

## 2021-11-09 NOTE — Patient Instructions (Signed)
We have sent in the trazodone to take in the evening to help with sleep and anxiety during the day. ? ? ?

## 2021-11-09 NOTE — Progress Notes (Signed)
? ?  Subjective:  ? ?Patient ID: Francisco Price, male    DOB: 08/28/1950, 71 y.o.   MRN: [id number] ? ?HPI ?The patient is a 71 YO man coming in for some anxiety and trouble sleeping. ? ?Review of Systems  ?Constitutional: Negative.   ?HENT: Negative.    ?Eyes: Negative.   ?Respiratory:  Negative for cough, chest tightness and shortness of breath.   ?Cardiovascular:  Negative for chest pain, palpitations and leg swelling.  ?Gastrointestinal:  Negative for abdominal distention, abdominal pain, constipation, diarrhea, nausea and vomiting.  ?Musculoskeletal: Negative.   ?Skin: Negative.   ?Neurological: Negative.   ?Psychiatric/Behavioral:  Positive for sleep disturbance. The patient is nervous/anxious.   ? ?Objective:  ?Physical Exam ?Constitutional:   ?   Appearance: He is well-developed.  ?HENT:  ?   Head: Normocephalic and atraumatic.  ?Cardiovascular:  ?   Rate and Rhythm: Normal rate and regular rhythm.  ?Pulmonary:  ?   Effort: Pulmonary effort is normal. No respiratory distress.  ?   Breath sounds: Normal breath sounds. No wheezing or rales.  ?Abdominal:  ?   General: Bowel sounds are normal. There is no distension.  ?   Palpations: Abdomen is soft.  ?   Tenderness: There is no abdominal tenderness. There is no rebound.  ?Musculoskeletal:  ?   Cervical back: Normal range of motion.  ?Skin: ?   General: Skin is warm and dry.  ?Neurological:  ?   Mental Status: He is alert and oriented to person, place, and time.  ?   Coordination: Coordination normal.  ? ? ?Vitals:  ? 11/09/21 0946  ?BP: 124/70  ?Pulse: 70  ?Resp: 18  ?SpO2: 97%  ?Weight: 218 lb 3.2 oz (99 kg)  ?Height: '6\' 2"'$  (1.88 m)  ? ? ?This visit occurred during the SARS-CoV-2 public health emergency.  Safety protocols were in place, including screening questions prior to the visit, additional usage of staff PPE, and extensive cleaning of exam room while observing appropriate contact time as indicated for disinfecting solutions.  ? ?Assessment & Plan:  ? ?

## 2021-11-18 ENCOUNTER — Encounter: Payer: Self-pay | Admitting: Internal Medicine

## 2021-11-19 MED ORDER — ESCITALOPRAM OXALATE 10 MG PO TABS: 10.0000 mg | ORAL_TABLET | Freq: Every day | ORAL | 3 refills | Status: DC

## 2021-12-08 ENCOUNTER — Other Ambulatory Visit: Payer: Self-pay | Admitting: Internal Medicine

## 2021-12-11 ENCOUNTER — Other Ambulatory Visit: Payer: Self-pay | Admitting: Internal Medicine

## 2021-12-22 ENCOUNTER — Encounter: Payer: Managed Care, Other (non HMO) | Admitting: Internal Medicine

## 2022-01-15 ENCOUNTER — Other Ambulatory Visit: Payer: Self-pay | Admitting: Internal Medicine

## 2022-01-31 ENCOUNTER — Encounter: Payer: Self-pay | Admitting: Internal Medicine

## 2022-02-01 ENCOUNTER — Other Ambulatory Visit: Payer: Self-pay

## 2022-02-01 MED ORDER — ATORVASTATIN CALCIUM 80 MG PO TABS: 80.0000 mg | ORAL_TABLET | Freq: Every day | ORAL | 1 refills | Status: DC

## 2022-02-01 MED ORDER — TAMSULOSIN HCL 0.4 MG PO CAPS: 0.4000 mg | ORAL_CAPSULE | Freq: Every day | ORAL | 1 refills | Status: DC

## 2022-02-06 ENCOUNTER — Other Ambulatory Visit: Payer: Self-pay | Admitting: Internal Medicine

## 2022-02-17 ENCOUNTER — Encounter: Payer: Self-pay | Admitting: Internal Medicine

## 2022-02-17 ENCOUNTER — Ambulatory Visit (INDEPENDENT_AMBULATORY_CARE_PROVIDER_SITE_OTHER): Payer: Managed Care, Other (non HMO) | Admitting: Internal Medicine

## 2022-02-17 VITALS — BP 122/70 | HR 62 | Resp 18 | Ht 74.0 in | Wt 217.4 lb

## 2022-02-17 DIAGNOSIS — N138 Other obstructive and reflux uropathy: Secondary | ICD-10-CM

## 2022-02-17 DIAGNOSIS — Z8673 Personal history of transient ischemic attack (TIA), and cerebral infarction without residual deficits: Secondary | ICD-10-CM | POA: Diagnosis not present

## 2022-02-17 DIAGNOSIS — F40243 Fear of flying: Secondary | ICD-10-CM

## 2022-02-17 DIAGNOSIS — Z0000 Encounter for general adult medical examination without abnormal findings: Secondary | ICD-10-CM

## 2022-02-17 DIAGNOSIS — D126 Benign neoplasm of colon, unspecified: Secondary | ICD-10-CM

## 2022-02-17 DIAGNOSIS — N401 Enlarged prostate with lower urinary tract symptoms: Secondary | ICD-10-CM | POA: Diagnosis not present

## 2022-02-17 DIAGNOSIS — J0110 Acute frontal sinusitis, unspecified: Secondary | ICD-10-CM

## 2022-02-17 LAB — LIPID PANEL
Cholesterol: 93 mg/dL (ref 0–200)
HDL: 32 mg/dL — ABNORMAL LOW (ref >39.00)
LDL Cholesterol: 49 mg/dL (ref 0–99)
NonHDL: 60.92
Total CHOL/HDL Ratio: 3
Triglycerides: 58 mg/dL (ref 0.0–149.0)
VLDL: 11.6 mg/dL (ref 0.0–40.0)

## 2022-02-17 LAB — CBC
HCT: 44.8 % (ref 39.0–52.0)
Hemoglobin: 15.4 g/dL (ref 13.0–17.0)
MCHC: 34.3 g/dL (ref 30.0–36.0)
MCV: 90.1 fl (ref 78.0–100.0)
Platelets: 124 10*3/uL — ABNORMAL LOW (ref 150.0–400.0)
RBC: 4.98 Mil/uL (ref 4.22–5.81)
RDW: 13.1 % (ref 11.5–15.5)
WBC: 4.6 10*3/uL (ref 4.0–10.5)

## 2022-02-17 LAB — COMPREHENSIVE METABOLIC PANEL
ALT: 22 U/L (ref 0–53)
AST: 19 U/L (ref 0–37)
Albumin: 4.3 g/dL (ref 3.5–5.2)
Alkaline Phosphatase: 56 U/L (ref 39–117)
BUN: 19 mg/dL (ref 6–23)
CO2: 27 mEq/L (ref 19–32)
Calcium: 9.1 mg/dL (ref 8.4–10.5)
Chloride: 109 mEq/L (ref 96–112)
Creatinine, Ser: 1.28 mg/dL (ref 0.40–1.50)
GFR: 56.21 mL/min — ABNORMAL LOW (ref >60.00)
Glucose, Bld: 87 mg/dL (ref 70–99)
Potassium: 3.8 mEq/L (ref 3.5–5.1)
Sodium: 144 mEq/L (ref 135–145)
Total Bilirubin: 1 mg/dL (ref 0.2–1.2)
Total Protein: 6.3 g/dL (ref 6.0–8.3)

## 2022-02-17 MED ORDER — TADALAFIL 10 MG PO TABS: ORAL_TABLET | ORAL | 3 refills | Status: DC

## 2022-02-17 MED ORDER — AMOXICILLIN-POT CLAVULANATE 875-125 MG PO TABS: 1.0000 | ORAL_TABLET | Freq: Two times a day (BID) | ORAL | 0 refills | Status: AC

## 2022-02-17 MED ORDER — ALPRAZOLAM 0.5 MG PO TABS: 0.5000 mg | ORAL_TABLET | Freq: Two times a day (BID) | ORAL | 0 refills | Status: DC | PRN

## 2022-02-17 NOTE — Patient Instructions (Signed)
We will send in augmentin to take 1 pill twice a day for 1 week.  We have sent in xanax to use for flying.

## 2022-02-17 NOTE — Progress Notes (Signed)
   Subjective:   Patient ID: Francisco Price, male    DOB: 1951-04-01, 71 y.o.   MRN: [id number]  HPI The patient is a 71 YO man coming in for physical with concerns.  PMH, [facility], social history reviewed and updated  Review of Systems  Constitutional: Negative.  Negative for fatigue, fever and unexpected weight change.  HENT:  Positive for congestion, postnasal drip, rhinorrhea and sinus pressure. Negative for ear discharge, ear pain, sinus pain, sneezing, sore throat, tinnitus, trouble swallowing and voice change.   Eyes: Negative.   Respiratory:  Negative for cough, chest tightness, shortness of breath and wheezing.   Cardiovascular: Negative.  Negative for chest pain, palpitations and leg swelling.  Gastrointestinal: Negative.  Negative for abdominal distention, abdominal pain, constipation, diarrhea, nausea and vomiting.  Musculoskeletal: Negative.   Skin: Negative.   Neurological: Negative.   Psychiatric/Behavioral:  The patient is nervous/anxious.     Objective:  Physical Exam Constitutional:      Appearance: He is well-developed.  HENT:     Head: Normocephalic and atraumatic.     Comments: Oropharynx with redness and clear drainage, nose with swollen turbinates, TMs normal bilaterally.  Neck:     Thyroid: No thyromegaly.  Cardiovascular:     Rate and Rhythm: Normal rate and regular rhythm.  Pulmonary:     Effort: Pulmonary effort is normal. No respiratory distress.     Breath sounds: Normal breath sounds. No wheezing or rales.  Abdominal:     General: Bowel sounds are normal. There is no distension.     Palpations: Abdomen is soft.     Tenderness: There is no abdominal tenderness. There is no rebound.  Musculoskeletal:        General: No tenderness.     Cervical back: Normal range of motion.  Lymphadenopathy:     Cervical: No cervical adenopathy.  Skin:    General: Skin is warm and dry.  Neurological:     Mental Status: He is alert and oriented to person, place,  and time.     Coordination: Coordination normal.     Vitals:   02/17/22 0813  BP: 122/70  Pulse: 62  Resp: 18  SpO2: 98%  Weight: 217 lb 6.4 oz (98.6 kg)  Height: '6\' 2"'$  (1.88 m)    Assessment & Plan:

## 2022-02-18 DIAGNOSIS — J019 Acute sinusitis, unspecified: Secondary | ICD-10-CM | POA: Insufficient documentation

## 2022-02-18 DIAGNOSIS — F419 Anxiety disorder, unspecified: Secondary | ICD-10-CM | POA: Insufficient documentation

## 2022-02-18 DIAGNOSIS — F40243 Fear of flying: Secondary | ICD-10-CM | POA: Insufficient documentation

## 2022-02-18 NOTE — Assessment & Plan Note (Signed)
Rx alprazolam 0.5 mg bid prn flying. Last filled 2017 and uses rarely. Has trip upcoming this fall.

## 2022-02-18 NOTE — Assessment & Plan Note (Signed)
Counseled about need for colonoscopy and recommendation to not do cologuard. Poor difficulty with tolerating prep previously and advised that they can do this with extended liquid diet and laxatives instead of liquid prep and he will consider this.

## 2022-02-18 NOTE — Assessment & Plan Note (Signed)
Going on for 2-3 weeks and rx augmentin 7 day course to clear. Advised to use otc allergy medication as well. May have been triggered by air quality.

## 2022-02-18 NOTE — Assessment & Plan Note (Signed)
BP at goal and taking statin and aspirin. Needs yearly at least monitoring of sugars and BP. No symptoms of stroke left. No new symptoms of stroke.

## 2022-02-18 NOTE — Assessment & Plan Note (Signed)
Flu shot yearly. Covid-19 counseled. Pneumonia complete. Shingrix due at pharmacy. Tetanus due at pharmacy. Colonoscopy due declines. Counseled about sun safety and mole surveillance. Counseled about the dangers of distracted driving. Given 10 year screening recommendations.

## 2022-02-18 NOTE — Assessment & Plan Note (Signed)
Takes cialis daily 10 mg daily and will refill. Continue.

## 2022-07-05 ENCOUNTER — Telehealth: Payer: Managed Care, Other (non HMO) | Admitting: Nurse Practitioner

## 2022-07-05 DIAGNOSIS — L719 Rosacea, unspecified: Secondary | ICD-10-CM

## 2022-07-05 MED ORDER — DOXYCYCLINE HYCLATE 100 MG PO TABS: 100.0000 mg | ORAL_TABLET | Freq: Every day | ORAL | 0 refills | Status: AC

## 2022-07-05 NOTE — Progress Notes (Signed)
E visit for Rosacea We are sorry that you are not feeling well. Here is how we plan to help! Based on what you shared with me it looks like you have Rosacea.  Rosacea is a common chronic skin condition that usually only affects the face and eyes.  Occasionally, the neck, chest, or other areas may be involved.  Characterized by redness, pimples, and broken blood vessels, rosacea tends to begin after middle age (between the ages of 18 and 78).  It is more common in fair-skinned people  The cause of rosacea is not fully understood. We do know that rosacea is worsened by various trigger factors including, spicy or hot foods, hot beverages such as coffee or tea, alcohol, and sun exposure just to name a few.  Signs of rosacea may vary greatly from person to person. In some individuals it may only flare up from time to time.   I have prescribed: An oral antibiotic that may lessen the redness of your skin called doxycycline '100mg'$  daily.   HOME CARE: Keep a record of triggers, such as stress, weather, or certain foods or drinks. Consider limiting hot or spicy foods and alcohol. Always use sunscreen that protects against UVA and UVB rays and has a sun-protecting factor (SPF) of 15 or higher. Avoid putting steroids on the skin sores. Steroids may make rosacea worse.  You may use small amounts of water based cosmetic while using this medication.  Apply cosmetics after cream has dried. If you shave your face, use an electric razor Don't scrub your skin or use sponges, brushes, or other abrasive tools. Doing so can irritate your skin. GET HELP RIGHT AWAY IF: If your rosacea gets worse or is not better within 4 weeks. If a new skin condition or rash develops. Loss of feeling or tingling of treated area Nausea  MAKE SURE YOU   Understand these instructions. Will watch your condition. Will get help right away if you are not doing well or get worse.   Thank you for choosing an e-visit.  Your e-visit  answers were reviewed by a board certified advanced clinical practitioner to complete your personal care plan. Depending upon the condition, your plan could have included both over the counter or prescription medications.  Please review your pharmacy choice. Make sure the pharmacy is open so you can pick up prescription now. If there is a problem, you may contact your provider through [employer] and have the prescription routed to another pharmacy.  Your safety is important to [place]. If you have drug allergies check your prescription carefully.   For the next 24 hours you can use MyChart to ask questions about today's visit, request a non-urgent call back, or ask for a work or school excuse. You will get an email in the next two days asking about your experience. I hope that your e-visit has been valuable and will speed your recovery.   Meds ordered this encounter  Medications   doxycycline (VIBRA-TABS) 100 MG tablet    Sig: Take 1 tablet (100 mg total) by mouth daily for 10 days.    Dispense:  10 tablet    Refill:  0    I spent approximately 5 minutes reviewing the patient's history, current symptoms and coordinating their care today.

## 2022-07-10 ENCOUNTER — Other Ambulatory Visit: Payer: Self-pay | Admitting: Internal Medicine

## 2022-08-27 ENCOUNTER — Other Ambulatory Visit: Payer: Self-pay | Admitting: Internal Medicine

## 2022-08-30 ENCOUNTER — Encounter: Payer: Self-pay | Admitting: Internal Medicine

## 2022-08-30 ENCOUNTER — Telehealth: Payer: Self-pay | Admitting: Internal Medicine

## 2022-08-30 NOTE — Telephone Encounter (Signed)
No refills on antibiotics. Pt must see MD for antibiotics. Pls call pt to schedule appt.Marland KitchenJohny Price

## 2022-08-30 NOTE — Telephone Encounter (Signed)
Patient called requesting a refill on a medication that Dr. Sharlet Salina prescribed to him one time amoxicillin-clavulanate (AUGMENTIN) 875-125 MG tablet [[id number]].    Patient is requesting this medication to be call into his pharmacy on file  [place], [place]  . Best callback number for patient is [phone].

## 2022-08-30 NOTE — Telephone Encounter (Signed)
Called pt lvm to callback to schedule appt with provider.

## 2022-08-31 ENCOUNTER — Telehealth: Payer: Managed Care, Other (non HMO) | Admitting: Emergency Medicine

## 2022-08-31 ENCOUNTER — Ambulatory Visit
Admission: EM | Admit: 2022-08-31 | Discharge: 2022-08-31 | Disposition: A | Discharge status: Home / Self Care | Payer: Managed Care, Other (non HMO) | Attending: Urgent Care | Admitting: Urgent Care

## 2022-08-31 DIAGNOSIS — I10 Essential (primary) hypertension: Secondary | ICD-10-CM | POA: Diagnosis not present

## 2022-08-31 DIAGNOSIS — H9202 Otalgia, left ear: Secondary | ICD-10-CM

## 2022-08-31 DIAGNOSIS — J0101 Acute recurrent maxillary sinusitis: Secondary | ICD-10-CM

## 2022-08-31 DIAGNOSIS — Z8673 Personal history of transient ischemic attack (TIA), and cerebral infarction without residual deficits: Secondary | ICD-10-CM | POA: Diagnosis not present

## 2022-08-31 MED ORDER — AMOXICILLIN-POT CLAVULANATE 875-125 MG PO TABS: 1.0000 | ORAL_TABLET | Freq: Two times a day (BID) | ORAL | 0 refills | Status: DC

## 2022-08-31 MED ORDER — IPRATROPIUM BROMIDE 0.03 % NA SOLN: 2.0000 | Freq: Two times a day (BID) | NASAL | 0 refills | Status: DC

## 2022-08-31 MED ORDER — CETIRIZINE HCL 10 MG PO TABS: 10.0000 mg | ORAL_TABLET | Freq: Every day | ORAL | 0 refills | Status: DC

## 2022-08-31 NOTE — ED Triage Notes (Signed)
Pt c/o left side facial swelling x 3 days-reports hx of same in the past with "some kind of infected gland"-treated with augmentin-NAD-steady gait

## 2022-08-31 NOTE — Discharge Instructions (Signed)
I am managing you for a recurrent sinus infection with Augmentin. Hold off on using Claritin D. Switch to Zyrtec and Atrovent. Follow up with your PCP.

## 2022-08-31 NOTE — ED Provider Notes (Signed)
[facility]  Note:  This document was prepared using Systems analyst and may include unintentional dictation errors.  MRN: [id number] DOB: 1951-05-03  Subjective:   Francisco Price is a 72 y.o. male presenting for 3 day history of acute onset recurrent left-sided facial pain, left-sided sinus pressure.  Patient has a history of sinus infections, allergic rhinitis.  Takes Claritin-D every day.  He has concerns about his salivary glands, states that the last time he had an infection like this they gave him Augmentin and his symptoms resolved quickly.  No headache, confusion, weakness, ear drainage, tinnitus, left-sided neck pain, left arm pain, chest pain, heart racing, palpitations.  Patient does have a history of cerebrovascular disease.  Has not had a recurrence.  No history of heart disease.  Patient is not a smoker.  He does have hypertension, takes blood pressure medicine.  Regarding his teeth, denies any dental pain, just had a visit with his dental specialist and reports that everything was fine.  No current facility-administered medications for this encounter.  Current Outpatient Medications:    ALPRAZolam (XANAX) 0.5 MG tablet, Take 1 tablet (0.5 mg total) by mouth 2 (two) times daily as needed for anxiety., Disp: 10 tablet, Rfl: 0   aspirin EC 325 MG tablet, Take 1 tablet (325 mg total) by mouth daily., Disp: 30 tablet, Rfl: 11   atorvastatin (LIPITOR) 80 MG tablet, TAKE 1 TABLET DAILY, Disp: 90 tablet, Rfl: 3   esomeprazole (NEXIUM) 20 MG capsule, , Disp: , Rfl:    loratadine (CLARITIN) 10 MG tablet, Take 10 mg by mouth daily as needed for allergies., Disp: , Rfl:    tadalafil (CIALIS) 10 MG tablet, Take 1 tablet by mouth once daily, Disp: 90 tablet, Rfl: 3   tamsulosin (FLOMAX) 0.4 MG CAPS capsule, TAKE 1 CAPSULE DAILY, Disp: 90 capsule, Rfl: 1   Allergies  Allergen Reactions   Niacin     REACTION: intol w/ flushing    Past Medical  History:  Diagnosis Date   Benign prostatic hypertrophy    DDD (degenerative disc disease)    Hx of colonic polyps    Hypercholesteremia    Panic attack    Umbilical hernia      Past Surgical History:  Procedure Laterality Date   KNEE ARTHROSCOPY Left     Family History  Problem Relation Age of Onset   Heart disease Father    Heart disease Mother    Heart disease Brother     Social History   Tobacco Use   Smoking status: Never   Smokeless tobacco: Never  Vaping Use   Vaping Use: Never used  Substance Use Topics   Alcohol use: Yes    Comment: occ   Drug use: No    ROS   Objective:   Vitals: BP (!) 162/89 (BP Location: Right Arm)   Pulse 74   Temp 97.7 F (36.5 C) (Oral)   Resp 20   SpO2 97%   Physical Exam Constitutional:      General: He is not in acute distress.    Appearance: Normal appearance. He is well-developed and normal weight. He is not ill-appearing, toxic-appearing or diaphoretic.  HENT:     Head: Normocephalic and atraumatic.      Right Ear: Tympanic membrane, ear canal and external ear normal. No drainage, swelling or tenderness. No middle ear effusion. There is no impacted cerumen. Tympanic membrane is not erythematous or bulging.  Left Ear: Tympanic membrane, ear canal and external ear normal. No drainage, swelling or tenderness.  No middle ear effusion. There is no impacted cerumen. Tympanic membrane is not erythematous or bulging.     Nose: Nasal tenderness, mucosal edema and congestion present. No rhinorrhea.     Right Turbinates: Swollen.     Left Turbinates: Swollen.     Left Sinus: Maxillary sinus tenderness present.     Mouth/Throat:     Mouth: Mucous membranes are moist.     Dentition: Abnormal dentition. Does not have dentures. No dental tenderness, gingival swelling, dental caries, dental abscesses or gum lesions.     Pharynx: No pharyngeal swelling, oropharyngeal exudate, posterior oropharyngeal erythema or uvula swelling.      Tonsils: No tonsillar exudate or tonsillar abscesses. 0 on the right. 0 on the left.  Eyes:     General: No scleral icterus.       Right eye: No discharge.        Left eye: No discharge.     Extraocular Movements: Extraocular movements intact.     Conjunctiva/sclera: Conjunctivae normal.  Cardiovascular:     Rate and Rhythm: Normal rate and regular rhythm.     Heart sounds: Normal heart sounds. No murmur heard.    No friction rub. No gallop.  Pulmonary:     Effort: Pulmonary effort is normal. No respiratory distress.     Breath sounds: Normal breath sounds. No stridor. No wheezing, rhonchi or rales.  Musculoskeletal:     Cervical back: Normal range of motion and neck supple. No rigidity. No muscular tenderness.  Neurological:     General: No focal deficit present.     Mental Status: He is alert and oriented to person, place, and time.  Psychiatric:        Mood and Affect: Mood normal.        Behavior: Behavior normal.        Thought Content: Thought content normal.        Judgment: Judgment normal.     Assessment and Plan :   PDMP not reviewed this encounter.  1. Acute recurrent maxillary sinusitis   2. Essential hypertension   3. History of stroke     Creatinine clearance calculated at 76m/min, okay to use Augmentin.  Recommended covering for recurrent maxillary sinusitis given his sinus symptoms, left-sided maxillary sinus tenderness.  Use supportive care otherwise. Reviewed heart score, at this stage will defer EKG as I have low suspicion for ACS.  Emphasized need to avoid decongestants, pseudoephedrine as he takes it with Claritin-D and is affecting his blood pressure.  Patient is to check back and closely follow-up with his PCP. Counseled patient on potential for adverse effects with medications prescribed/recommended today, ER and return-to-clinic precautions discussed, patient verbalized understanding.   MJaynee Eagles PVermont02/07/24 1232

## 2022-08-31 NOTE — Progress Notes (Signed)
Because an ear exam is needed to properly diagnose your condition, I feel your condition warrants further evaluation and I recommend that you be seen in a face to face visit.   NOTE: There will be NO CHARGE for this eVisit   If you are having a true medical emergency please call 911.      For an urgent face to face visit, [facility] has eight urgent care centers for your convenience:   NEW!! [facility] [place] at [place] Get Driving Directions [phone] [address] [place], [place] [facility] [place] at [place] Get Driving Directions [phone] [place], [place] [address]   [place] [facility] [place] [facility]) Get Driving Directions [phone] [address] [place] [address], [place] [address]  [facility] [place] ([place]) Get Driving Directions [phone] [address] [place],  [place]  [address]  [place] [facility] [place] [facility] at [facility] Get Driving Directions  [phone] [place],  [place] [address]   [facility] Get Driving Directions [phone] [place], [place], [place] [address]   [facility] Get Driving Directions  [phone] [address] [place], [place] [address]   [facility] [place] Get Driving Directions [phone] [address], [place], [place] [address]  Your MyChart E-visit questionnaire answers were reviewed by a board certified advanced clinical practitioner to complete your personal care plan based on your specific symptoms.  Thank you for using e-Visits.   Approximately 5 minutes was used in reviewing the patient's chart, questionnaire, prescribing medications, and documentation.

## 2022-09-01 ENCOUNTER — Ambulatory Visit: Payer: Managed Care, Other (non HMO) | Admitting: Family Medicine

## 2022-09-04 ENCOUNTER — Other Ambulatory Visit: Payer: Self-pay

## 2022-09-04 ENCOUNTER — Ambulatory Visit
Admission: EM | Admit: 2022-09-04 | Discharge: 2022-09-04 | Disposition: A | Discharge status: Home / Self Care | Payer: Managed Care, Other (non HMO) | Attending: Nurse Practitioner | Admitting: Nurse Practitioner

## 2022-09-04 ENCOUNTER — Emergency Department (HOSPITAL_BASED_OUTPATIENT_CLINIC_OR_DEPARTMENT_OTHER): Payer: Managed Care, Other (non HMO)

## 2022-09-04 ENCOUNTER — Emergency Department (HOSPITAL_BASED_OUTPATIENT_CLINIC_OR_DEPARTMENT_OTHER)
Admission: EM | Admit: 2022-09-04 | Discharge: 2022-09-04 | Disposition: A | Discharge status: Home / Self Care | Payer: Managed Care, Other (non HMO) | Attending: Emergency Medicine | Admitting: Emergency Medicine

## 2022-09-04 DIAGNOSIS — G500 Trigeminal neuralgia: Secondary | ICD-10-CM | POA: Insufficient documentation

## 2022-09-04 DIAGNOSIS — R6884 Jaw pain: Secondary | ICD-10-CM | POA: Diagnosis not present

## 2022-09-04 DIAGNOSIS — R519 Headache, unspecified: Secondary | ICD-10-CM | POA: Diagnosis present

## 2022-09-04 LAB — CBC WITH DIFFERENTIAL/PLATELET
Abs Immature Granulocytes: 0.01 10*3/uL (ref 0.00–0.07)
Basophils Absolute: 0 10*3/uL (ref 0.0–0.1)
Basophils Relative: 1 %
Eosinophils Absolute: 0.2 10*3/uL (ref 0.0–0.5)
Eosinophils Relative: 3 %
HCT: 46.2 % (ref 39.0–52.0)
Hemoglobin: 16.3 g/dL (ref 13.0–17.0)
Immature Granulocytes: 0 %
Lymphocytes Relative: 15 %
Lymphs Abs: 0.9 10*3/uL (ref 0.7–4.0)
MCH: 30.6 pg (ref 26.0–34.0)
MCHC: 35.3 g/dL (ref 30.0–36.0)
MCV: 86.7 fL (ref 80.0–100.0)
Monocytes Absolute: 0.4 10*3/uL (ref 0.1–1.0)
Monocytes Relative: 6 %
Neutro Abs: 4.6 10*3/uL (ref 1.7–7.7)
Neutrophils Relative %: 75 %
Platelets: 133 10*3/uL — ABNORMAL LOW (ref 150–400)
RBC: 5.33 MIL/uL (ref 4.22–5.81)
RDW: 12.5 % (ref 11.5–15.5)
WBC: 6.1 10*3/uL (ref 4.0–10.5)
nRBC: 0 % (ref 0.0–0.2)

## 2022-09-04 LAB — BASIC METABOLIC PANEL
Anion gap: 8 (ref 5–15)
BUN: 8 mg/dL (ref 8–23)
CO2: 27 mmol/L (ref 22–32)
Calcium: 9.4 mg/dL (ref 8.9–10.3)
Chloride: 105 mmol/L (ref 98–111)
Creatinine, Ser: 1.23 mg/dL (ref 0.61–1.24)
GFR, Estimated: >60 mL/min (ref >60)
Glucose, Bld: 105 mg/dL — ABNORMAL HIGH (ref 70–99)
Potassium: 4.7 mmol/L (ref 3.5–5.1)
Sodium: 140 mmol/L (ref 135–145)

## 2022-09-04 MED ORDER — CARBAMAZEPINE 200 MG PO TABS: 100.0000 mg | ORAL_TABLET | Freq: Two times a day (BID) | ORAL | 0 refills | Status: DC

## 2022-09-04 MED ORDER — OXYCODONE-ACETAMINOPHEN 5-325 MG PO TABS: 1.0000 | ORAL_TABLET | Freq: Four times a day (QID) | ORAL | 0 refills | Status: DC | PRN

## 2022-09-04 MED ORDER — ONDANSETRON HCL 4 MG/2ML IJ SOLN
4.0000 mg | Freq: Once | INTRAMUSCULAR | Status: AC
  Administered 2022-09-04: 4 mg via INTRAVENOUS
  Filled 2022-09-04: qty 2

## 2022-09-04 MED ORDER — MORPHINE SULFATE (PF) 4 MG/ML IV SOLN
4.0000 mg | Freq: Once | INTRAVENOUS | Status: AC
  Administered 2022-09-04: 4 mg via INTRAVENOUS
  Filled 2022-09-04: qty 1

## 2022-09-04 MED ORDER — FENTANYL CITRATE PF 50 MCG/ML IJ SOSY
50.0000 ug | PREFILLED_SYRINGE | Freq: Once | INTRAMUSCULAR | Status: AC
  Administered 2022-09-04: 50 ug via INTRAVENOUS
  Filled 2022-09-04: qty 1

## 2022-09-04 MED ORDER — IOHEXOL 300 MG/ML  SOLN
100.0000 mL | Freq: Once | INTRAMUSCULAR | Status: AC | PRN
  Administered 2022-09-04: 75 mL via INTRAVENOUS

## 2022-09-04 MED ORDER — SENNOSIDES-DOCUSATE SODIUM 8.6-50 MG PO TABS: 1.0000 | ORAL_TABLET | Freq: Every evening | ORAL | 0 refills | Status: DC | PRN

## 2022-09-04 NOTE — ED Provider Notes (Signed)
Emergency Department Provider Note   I have reviewed the triage vital signs and the nursing notes.   HISTORY  Chief Complaint Facial Pain   HPI Francisco Price is a 72 y.o. male presents to the ED with left face pain. He describes intermittent, sharp, shooting pain to the left face. Perhaps some mild swelling noted by family. No redness. No fever. No SOB. No injury. Patient was empirically treated for sinusitis with abx but no relief. No neck pain.    Past Medical History:  Diagnosis Date   Benign prostatic hypertrophy    DDD (degenerative disc disease)    Hx of colonic polyps    Hypercholesteremia    Panic attack    Umbilical hernia     Review of Systems  Constitutional: No fever/chills Eyes: No visual changes. ENT: No sore throat. Positive left face pain.  Cardiovascular: Denies chest pain. Respiratory: Denies shortness of breath. Gastrointestinal: No abdominal pain.  No nausea, no vomiting.  No diarrhea.  No constipation. Genitourinary: Negative for dysuria. Musculoskeletal: Negative for back pain. Skin: Negative for rash. Neurological: Negative for headaches, focal weakness or numbness.  ____________________________________________   PHYSICAL EXAM:  VITAL SIGNS: ED Triage Vitals  Enc Vitals Group     BP 09/04/22 0954 (!) 188/121     Pulse Rate 09/04/22 0954 68     Resp 09/04/22 0954 20     Temp 09/04/22 0954 98.2 F (36.8 C)     Temp Source 09/04/22 0954 Temporal     SpO2 09/04/22 0954 99 %     Weight 09/04/22 0955 217 lb 6 oz (98.6 kg)     Height 09/04/22 0955 6' 2"$  (1.88 m)   Constitutional: Alert and oriented. Well appearing and in no acute distress. Eyes: Conjunctivae are normal. PERRL. EOMI. Head: Atraumatic. No tenderness over the temporal artery.  Ears:  Healthy appearing ear canals and TMs bilaterally Nose: No congestion/rhinnorhea. Mouth/Throat: Mucous membranes are moist.  Oropharynx non-erythematous. No appreciable swelling. No  trismus.  Neck: No stridor.   Cardiovascular: Normal rate, regular rhythm. Good peripheral circulation. Grossly normal heart sounds.   Respiratory: Normal respiratory effort.  No retractions. Lungs CTAB. Gastrointestinal: Soft and nontender. No distention.  Musculoskeletal: No lower extremity tenderness nor edema. No gross deformities of extremities. Neurologic:  Normal speech and language. No gross focal neurologic deficits are appreciated.  Skin:  Skin is warm, dry and intact. No rash noted.  ____________________________________________   LABS (all labs ordered are listed, but only abnormal results are displayed)  Labs Reviewed  BASIC METABOLIC PANEL - Abnormal; Notable for the following components:      Result Value   Glucose, Bld 105 (*)    All other components within normal limits  CBC WITH DIFFERENTIAL/PLATELET - Abnormal; Notable for the following components:   Platelets 133 (*)    All other components within normal limits    ____________________________________________   PROCEDURES  Procedure(s) performed:   Procedures  None ____________________________________________   INITIAL IMPRESSION / ASSESSMENT AND PLAN / ED COURSE  Pertinent labs & imaging results that were available during my care of the patient were reviewed by me and considered in my medical decision making (see chart for details).   This patient is Presenting for Evaluation of face pain, which does require a range of treatment options, and is a complaint that involves a high risk of morbidity and mortality.  The Differential Diagnoses include face abscess, cellulitis, dental pain, malignant otitis, trigeminal neuralgia, etc.  Critical  Interventions-    Medications  fentaNYL (SUBLIMAZE) injection 50 mcg (50 mcg Intravenous Given 09/04/22 1054)  ondansetron (ZOFRAN) injection 4 mg (4 mg Intravenous Given 09/04/22 1051)  iohexol (OMNIPAQUE) 300 MG/ML solution 100 mL (75 mLs Intravenous Contrast Given  09/04/22 1159)  morphine (PF) 4 MG/ML injection 4 mg (4 mg Intravenous Given 09/04/22 1208)    Reassessment after intervention: Pain improved.    I did obtain Additional Historical Information from wife at bedside.    Clinical Laboratory Tests Ordered, included CBC without leukocytosis. No AKI.   Radiologic Tests Ordered, included CT max/face. I independently interpreted the images and agree with radiology interpretation.   Medical Decision Making: Summary:  Patient presents with intermittent sharp/shooting pain to the left face. No injury. Exam not consistent with temporal arteritis, although considered. CT imaging reassuring.   Reevaluation with update and discussion with patient. Plan to start treatment for trigeminal neuralgia. Will need close PCP follow up and provided referral to neurology as well. Considered admit but pain is well controlled.    Patient's presentation is most consistent with acute presentation with potential threat to life or bodily function.   Disposition: discharge  ____________________________________________  FINAL CLINICAL IMPRESSION(S) / ED DIAGNOSES  Final diagnoses:  Trigeminal neuralgia of left side of face     NEW OUTPATIENT MEDICATIONS STARTED DURING THIS VISIT:  Discharge Medication List as of 09/04/2022  1:28 PM     START taking these medications   Details  carbamazepine (TEGRETOL) 200 MG tablet Take 0.5 tablets (100 mg total) by mouth 2 (two) times daily for 14 days., Starting Sun 09/04/2022, Until Sun 09/18/2022, Normal    oxyCODONE-acetaminophen (PERCOCET/ROXICET) 5-325 MG tablet Take 1 tablet by mouth every 6 (six) hours as needed for severe pain., Starting Sun 09/04/2022, Normal    senna-docusate (SENOKOT-S) 8.6-50 MG tablet Take 1 tablet by mouth at bedtime as needed for mild constipation or moderate constipation., Starting Sun 09/04/2022, Normal        Note:  This document was prepared using Dragon voice recognition software and may  include unintentional dictation errors.  Nanda Quinton, MD, [facility] Emergency Medicine    Natsumi Whitsitt, [facility], MD 09/07/22 1242

## 2022-09-04 NOTE — ED Notes (Signed)
Patient is being discharged from the Urgent Care and sent to the Emergency Department via POV with spouse. Per Rosario Adie NP, patient is in need of higher level of care due to need for further evaluation. Patient is aware and verbalizes understanding of plan of care.  Vitals:   09/04/22 0840 09/04/22 0843  BP:  (!) 174/98  Pulse: 72   Resp: 20   SpO2: 97%

## 2022-09-04 NOTE — ED Triage Notes (Signed)
Pt states he has been having left jaw pain (pressure/ throbbing). Pain is worse while lying down.   Dental appt on Tuesday   Home interventions: abx (Augmentin), motrin, tylenol, HEAT/ICE PACKS   Started: about a week ago

## 2022-09-04 NOTE — ED Triage Notes (Signed)
Patient reports persistent left sided jaw pain x1 week. Pain is worse with lying down. States that he has completed a course of antibiotics and for a suspected sinus infection with no relief. He was seen at urgent care and referred here for a CT scan of the head.   Reports some diarrhea due to the antibiotics.

## 2022-09-04 NOTE — ED Provider Notes (Addendum)
[facility]    CSN: [id number] Arrival date & time: 09/04/22  [id number]      History   Chief Complaint Chief Complaint  Patient presents with   Jaw Pain    HPI Francisco Price is a 72 y.o. male presents for evaluation of jaw pain.  Patient reports 7 days of a left-sided jaw pain that is intermittent.  He describes it as a aching/burning sensation that sometimes improves when he stands up or drinks cold water.  It is worse when he lays down.  States he is unable to sleep at night.  At its worst is a 10 out of 10.  He denies any sinus pressure/pain, tooth ache, gum swelling, dry mouth, neck pain, chest pain, shortness of breath, left arm pain, ear pain, headache, or temporal pain.  No history of TMJ.  Pain is not triggered by light touch of the face.  No injury to the jaw.  He was seen on 2 7 in urgent care and treated for suspected sinusitis with Augmentin.  He is also been taking ibuprofen with no improvement in his symptoms.  States that history of salivary gland infection in the past that improved with Augmentin but he has not had any success with this round.  He has an appointment with his dentist on Tuesday.  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 Authorizing Provider  ALPRAZolam Duanne Moron) 0.5 MG tablet Take 1  tablet (0.5 mg total) by mouth 2 (two) times daily as needed for anxiety. 02/17/22   Hoyt Koch, MD  amoxicillin-clavulanate (AUGMENTIN) 875-125 MG tablet Take 1 tablet by mouth 2 (two) times daily. 08/31/22   Jaynee Eagles, PA-C  aspirin EC 325 MG tablet Take 1 tablet (325 mg total) by mouth daily. 06/15/21   Frann Rider, NP  atorvastatin (LIPITOR) 80 MG tablet TAKE 1 TABLET DAILY 07/11/22   Hoyt Koch, MD  cetirizine (ZYRTEC ALLERGY) 10 MG tablet Take 1 tablet (10 mg total) by mouth daily. 08/31/22   Jaynee Eagles, PA-C  esomeprazole (NEXIUM) 20 MG capsule     [provider]  ipratropium (ATROVENT) 0.03 % nasal spray Place 2 sprays into both nostrils 2 (two) times daily. 08/31/22   Jaynee Eagles, PA-C  loratadine (CLARITIN) 10 MG tablet Take 10 mg by mouth daily as needed for allergies.    [provider]  tadalafil (CIALIS) 10 MG tablet Take 1 tablet by mouth once daily 02/17/22   Hoyt Koch, MD  tamsulosin [facility]) 0.4 MG CAPS capsule TAKE 1 CAPSULE DAILY 08/29/22   Hoyt Koch, MD    Family History Family History  Problem Relation Age of Onset   Heart disease Father    Heart disease Mother  Heart disease Brother     Social History Social History   Tobacco Use   Smoking status: Never   Smokeless tobacco: Never  Vaping Use   Vaping Use: Never used  Substance Use Topics   Alcohol use: Yes    Comment: occ   Drug use: No     Allergies   Niacin   Review of Systems Review of Systems  Musculoskeletal:        Jaw pain     Physical Exam Triage Vital Signs ED Triage Vitals  Enc Vitals Group     BP 09/04/22 0843 (!) 174/98     Pulse Rate 09/04/22 0840 72     Resp 09/04/22 0840 20     Temp --      Temp src --      SpO2 09/04/22 0840 97 %     Weight --      Height --      Head Circumference --      Peak Flow --      Pain Score 09/04/22 0841 10     Pain Loc --      Pain Edu? --      Excl. in [place]? --    No data  found.  Updated Vital Signs BP (!) 174/98 (BP Location: Left Arm)   Pulse 72   Resp 20   SpO2 97%   Visual Acuity Right Eye Distance:   Left Eye Distance:   Bilateral Distance:    Right Eye Near:   Left Eye Near:    Bilateral Near:     Physical Exam Vitals and nursing note reviewed.  Constitutional:      General: He is not in acute distress.    Appearance: Normal appearance. He is not ill-appearing.  HENT:     Head: Normocephalic and atraumatic.     Jaw: No swelling or pain on movement.     Salivary Glands: Left salivary gland is not diffusely enlarged or tender.      Mouth/Throat:     Lips: Pink.     Mouth: Mucous membranes are moist. No oral lesions.     Dentition: No dental tenderness, gingival swelling, dental abscesses or gum lesions.     Tongue: No lesions. Tongue does not deviate from midline.     Palate: No mass and lesions.     Pharynx: Oropharynx is clear. Uvula midline. No posterior oropharyngeal erythema.  Eyes:     Pupils: Pupils are equal, round, and reactive to light.  Cardiovascular:     Rate and Rhythm: Normal rate.  Pulmonary:     Effort: Pulmonary effort is normal.  Skin:    General: Skin is warm and dry.  Neurological:     General: No focal deficit present.     Mental Status: He is alert and oriented to person, place, and time.  Psychiatric:        Mood and Affect: Mood normal.        Behavior: Behavior normal.     UC Treatments / Results  Labs (all labs ordered are listed, but only abnormal results are displayed) Labs Reviewed - No data to display  EKG   Radiology No results found.  Procedures ED EKG  Date/Time: 09/04/2022 9:26 AM  Performed by: Melynda Ripple, NP Authorized by: Melynda Ripple, NP   ECG interpreted by ED Physician in the absence of a cardiologist: no   Interpretation:    Details:  SR with no acute  ST-T wave changes or ectopy  (including critical care time)  Medications Ordered in UC Medications - No data to  display  Initial Impression / Assessment and Plan / UC Course  I have reviewed the triage vital signs and the nursing notes.  Pertinent labs & imaging results that were available during my care of the patient were reviewed by me and considered in my medical decision making (see chart for details).     Reviewed exam and symptoms with patient.  EKG is sinus rhythm with no acute changes.  Discussed multiple potential causes including trigeminal neuralgia, TMJ, salivary gland blockage/infection, atypical presentation for cardiac etiology, infection, etc.  Given his persistent symptoms with no identifiable cause and him reporting pain of 10 out of 10 at times.  Advised to go to the emergency room for further evaluation and treatment.  He is in agreement with plan will go POV to the ER.  He was instructed to pull over and call 911 for any worsening symptoms that occur in transit and he verbalized understanding Final Clinical Impressions(s) / UC Diagnoses   Final diagnoses:  Jaw pain   Discharge Instructions   None    ED Prescriptions   None    PDMP not reviewed this encounter.   Melynda Ripple, NP 09/04/22 [id number]    Melynda Ripple, NP 09/04/22 [phone]

## 2022-09-04 NOTE — Discharge Instructions (Signed)
You are seen in the emerged from today with left face pain.  Your CT scan did not show any abnormality and your lab testing was reassuring.  I think this may be coming from nerve pain in your face, called trigeminal neuralgia.  I am starting you on the low-dose of the medication used to treat this but either your primary care doctor or neurologist may need to increase the dose slowly to fully relieve your symptoms.  I have called in some stronger pain medicine, Percocet, to use as well but this will make you drowsy and cause constipation.  You cannot drive while taking this medicine.  Please call both your primary care doctor and neurologist tomorrow for close follow-up appointments.  Return to the emergency department any new or suddenly worsening symptoms.

## 2022-09-04 NOTE — Discharge Instructions (Addendum)
Please go to the emergency room for further evaluation and treatment of your symptoms

## 2022-09-05 ENCOUNTER — Telehealth: Payer: Self-pay

## 2022-09-05 NOTE — Transitions of Care (Post Inpatient/ED Visit) (Unsigned)
   09/05/2022  Name: Francisco Price MRN: [id number] DOB: 1951/07/19  Today's TOC FU Call Status: Today's TOC FU Call Status:: Unsuccessul Call (1st Attempt) Unsuccessful Call (1st Attempt) Date: 09/05/22  Attempted to reach the patient regarding the most recent Inpatient/ED visit.  Follow Up Plan: Additional outreach attempts will be made to reach the patient to complete the Transitions of Care (Post Inpatient/ED visit) call.   Signature Juanda Crumble, [place] Direct Dial [phone]

## 2022-09-06 NOTE — Transitions of Care (Post Inpatient/ED Visit) (Signed)
   09/06/2022  Name: ADAMA FERBER MRN: [id number] DOB: 12-09-1950  Today's TOC FU Call Status: Today's TOC FU Call Status:: Successful TOC FU Call Competed Unsuccessful Call (1st Attempt) Date: 09/05/22 [facility] FU Call Complete Date: 09/06/22  Transition Care Management Follow-up Telephone Call Date of Discharge: 09/04/22 Reason for ED Visit: Neurologic How have you been since you were released from the hospital?: Better Any questions or concerns?: No  Items Reviewed: Did you receive and understand the discharge instructions provided?: Yes Medications obtained and verified?: Yes (Medications Reviewed) Any new allergies since your discharge?: No Dietary orders reviewed?: NA Do you have support at home?: Yes People in Home: spouse  Home Care and Equipment/Supplies: [place] Ordered?: NA Any new equipment or medical supplies ordered?: NA  Functional Questionnaire: Do you need assistance with bathing/showering or dressing?: No Do you need assistance with meal preparation?: No Do you need assistance with eating?: No Do you have difficulty maintaining continence: No Do you need assistance with getting out of bed/getting out of a chair/moving?: No Do you have difficulty managing or taking your medications?: No  Folllow up appointments reviewed: PCP Follow-up appointment confirmed?: Yes Date of PCP follow-up appointment?: 09/15/22 Follow-up Provider: Dr Sharlet Salina [facility] Follow-up appointment confirmed?: No Reason Specialist Follow-Up Not Confirmed: [facility] Calling Clinician Notified Provider Practice of Needed Appointment (neurologist) Do you need transportation to your follow-up appointment?: No Do you understand care options if your condition(s) worsen?: Yes-patient verbalized understanding    [place], [place] Direct Dial [phone]

## 2022-09-15 ENCOUNTER — Ambulatory Visit: Payer: Managed Care, Other (non HMO) | Admitting: Internal Medicine

## 2022-09-15 ENCOUNTER — Encounter: Payer: Self-pay | Admitting: Internal Medicine

## 2022-09-15 VITALS — BP 160/80 | HR 75 | Temp 97.5°F | Ht 74.0 in | Wt 220.0 lb

## 2022-09-15 DIAGNOSIS — Z23 Encounter for immunization: Secondary | ICD-10-CM

## 2022-09-15 DIAGNOSIS — G500 Trigeminal neuralgia: Secondary | ICD-10-CM | POA: Diagnosis not present

## 2022-09-15 MED ORDER — CARBAMAZEPINE 200 MG PO TABS: 200.0000 mg | ORAL_TABLET | Freq: Two times a day (BID) | ORAL | 0 refills | Status: DC

## 2022-09-15 NOTE — Progress Notes (Signed)
   Subjective:   Patient ID: Francisco Price, male    DOB: May 26, 1951, 72 y.o.   MRN: [id number]  HPI The patient is a 72 YO man coming in for 2 urgent care and 1 ER follow up (with possible sinus infection later thought to be TMJ/trigeminal neuralgia after antibiotics failed and CT failed to show any sinus disease). He was given carbamazepine which has helped. He may want slightly higher dose could be better.   Review of Systems  Constitutional: Negative.   HENT: Negative.         Jaw pain  Eyes: Negative.   Respiratory:  Negative for cough, chest tightness and shortness of breath.   Cardiovascular:  Negative for chest pain, palpitations and leg swelling.  Gastrointestinal:  Negative for abdominal distention, abdominal pain, constipation, diarrhea, nausea and vomiting.  Musculoskeletal: Negative.   Skin: Negative.   Neurological: Negative.   Psychiatric/Behavioral: Negative.      Objective:  Physical Exam Constitutional:      Appearance: He is well-developed.  HENT:     Head: Normocephalic and atraumatic.     Ears:     Comments: Pain left jaw Cardiovascular:     Rate and Rhythm: Normal rate and regular rhythm.  Pulmonary:     Effort: Pulmonary effort is normal. No respiratory distress.     Breath sounds: Normal breath sounds. No wheezing or rales.  Abdominal:     General: Bowel sounds are normal. There is no distension.     Palpations: Abdomen is soft.     Tenderness: There is no abdominal tenderness. There is no rebound.  Musculoskeletal:     Cervical back: Normal range of motion.  Skin:    General: Skin is warm and dry.  Neurological:     Mental Status: He is alert and oriented to person, place, and time.     Coordination: Coordination normal.     Vitals:   09/15/22 1015 09/15/22 1016  BP: (!) 160/80 (!) 160/80  Pulse: 75   Temp: (!) 97.5 F (36.4 C)   TempSrc: Oral   SpO2: 92%   Weight: 220 lb (99.8 kg)   Height: 6' 2"$  (1.88 m)     Assessment & Plan:   Visit time 20 minutes in face to face communication with patient and coordination of care, additional 10 minutes spent in record review, coordination or care, ordering tests, communicating/referring to other healthcare professionals, documenting in medical records all on the same day of the visit for total time 30 minutes spent on the visit.

## 2022-09-15 NOTE — Patient Instructions (Addendum)
We have refilled the carbamapine for you.

## 2022-09-16 ENCOUNTER — Encounter: Payer: Self-pay | Admitting: Internal Medicine

## 2022-09-16 DIAGNOSIS — G500 Trigeminal neuralgia: Secondary | ICD-10-CM | POA: Insufficient documentation

## 2022-09-16 NOTE — Assessment & Plan Note (Signed)
Left jaw pain was severe for 1-2 weeks and after 2 urgent care and 1 ER visit treated with carbamazepine. He is doing well with this. We will increase to 200 mg BID prn and he can start to use as needed for pain once this is clearing. Still constant but 3-4/10 instead of 10/10.

## 2022-09-19 ENCOUNTER — Telehealth: Payer: Self-pay | Admitting: Internal Medicine

## 2022-09-19 NOTE — Telephone Encounter (Signed)
Scheduled toc for 11/01/22

## 2022-09-19 NOTE — Telephone Encounter (Signed)
Francisco Price with transfer. Can we get him schedule for a TOC appointment please

## 2022-09-19 NOTE — Telephone Encounter (Signed)
Fine with me

## 2022-09-19 NOTE — Telephone Encounter (Signed)
Pt's wife, [name], called asking if [facility] would accept him as TOC from Dr. Sharlet Salina at [facility]? [name] stated the pt would be more comfortable with a male provider. Call [phone] # [phone]

## 2022-10-08 ENCOUNTER — Other Ambulatory Visit: Payer: Self-pay | Admitting: Internal Medicine

## 2022-10-10 ENCOUNTER — Telehealth: Payer: Self-pay | Admitting: Internal Medicine

## 2022-10-10 MED ORDER — CARBAMAZEPINE 200 MG PO TABS: 200.0000 mg | ORAL_TABLET | Freq: Two times a day (BID) | ORAL | 5 refills | Status: DC

## 2022-10-10 NOTE — Telephone Encounter (Signed)
Prescription Request  10/10/2022  LOV: 09/15/2022  What is the name of the medication or equipment? carbamazepine (TEGRETOL) 200 MG tablet   Have you contacted your pharmacy to request a refill? No   Which pharmacy would you like this sent to?  [facility] - [address], [address] [place] [address] Phone: [phone] Fax: [phone]  [id number] - [place], [address] [place] [address] Phone: [phone] Fax: [phone]    Patient notified that their request is being sent to the clinical staff for review and that they should receive a response within 2 business days.   Please advise at Mobile [phone] (mobile)

## 2022-10-10 NOTE — Telephone Encounter (Signed)
Pls advise.../lmb 

## 2022-10-11 NOTE — Telephone Encounter (Signed)
MD sent refill to [facility]../lmb 

## 2022-10-30 IMAGING — MR MR HEAD W/O CM
4 of 7 series · 29 of 48 positions shown · non-contrast
Comparison: CTA Head and plain head CT 03/05/2021.

CLINICAL DATA: 69-year-old male code stroke presentation with
unrevealing CTA. Left side deficits.

EXAM:
MRI HEAD WITHOUT CONTRAST
TECHNIQUE: Multiplanar, multiecho pulse sequences of the brain and surrounding
structures were obtained without intravenous contrast.

[Series 2: DWI · axial · 3.0mm · 0.94mm/px · z∈[-45,+111]mm · 11 of 106 slices shown (1 of 2)]
[im 1/106]
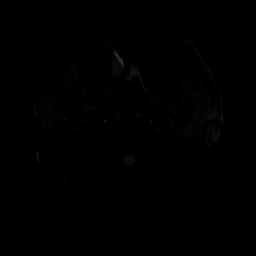
[im 11/106]
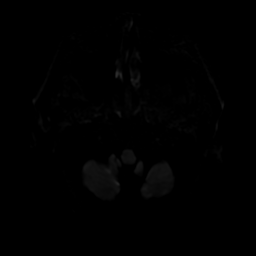
[im 22/106]
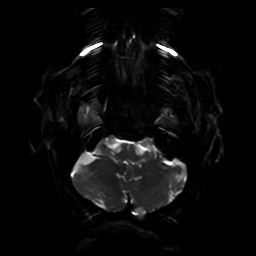
[im 32/106]
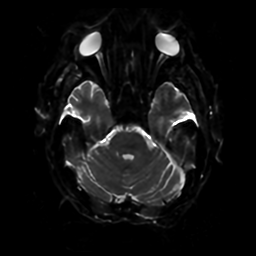
[im 43/106]
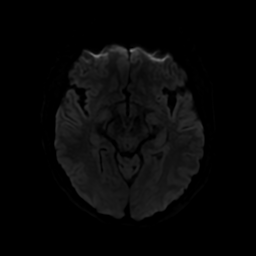
[im 53/106]
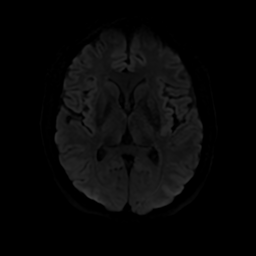
[im 64/106]
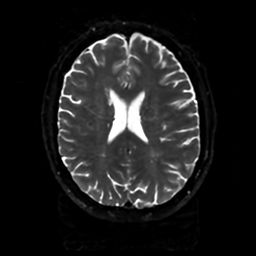
[im 74/106]
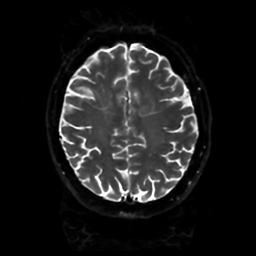
[im 85/106]
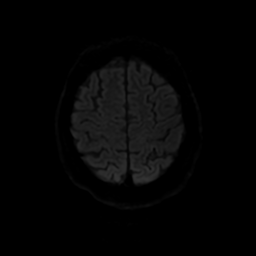
[im 95/106]
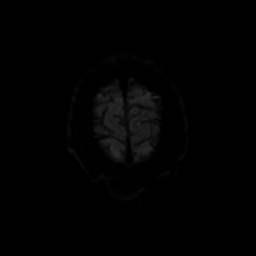
[im 106/106]
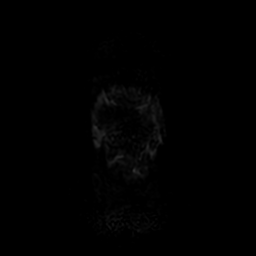

[Series 3: DWI · coronal · 4.0mm · 0.94mm/px · 9 of 76 slices shown (2 of 2)]
[im 1/76]
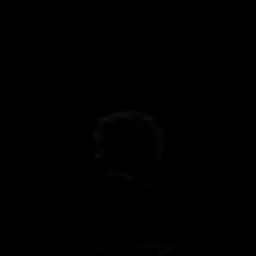
[im 10/76]
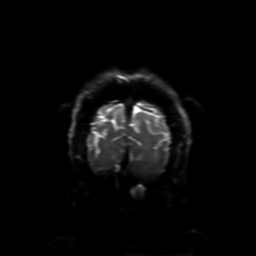
[im 19/76]
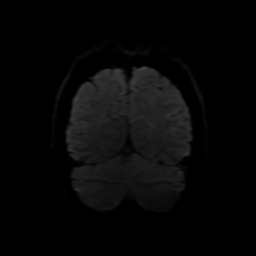
[im 29/76]
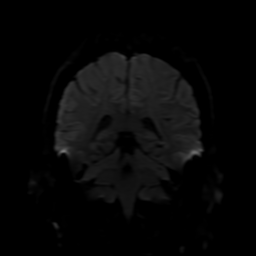
[im 38/76]
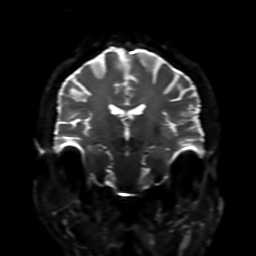
[im 47/76]
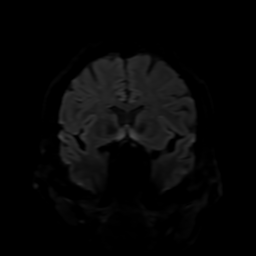
[im 57/76]
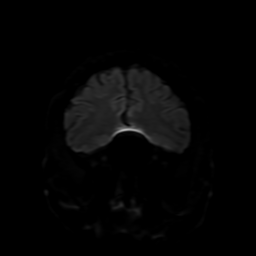
[im 66/76]
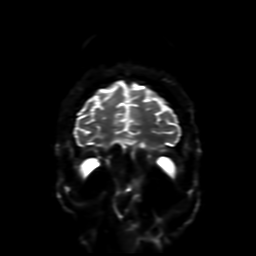
[im 76/76]
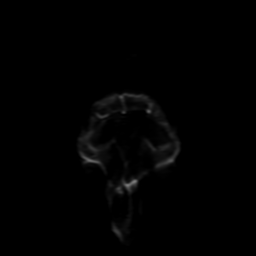

[Series 4: FLAIR · sagittal · 5.0mm · 0.23mm/px · 3 of 25 slices shown]
[im 1/25]
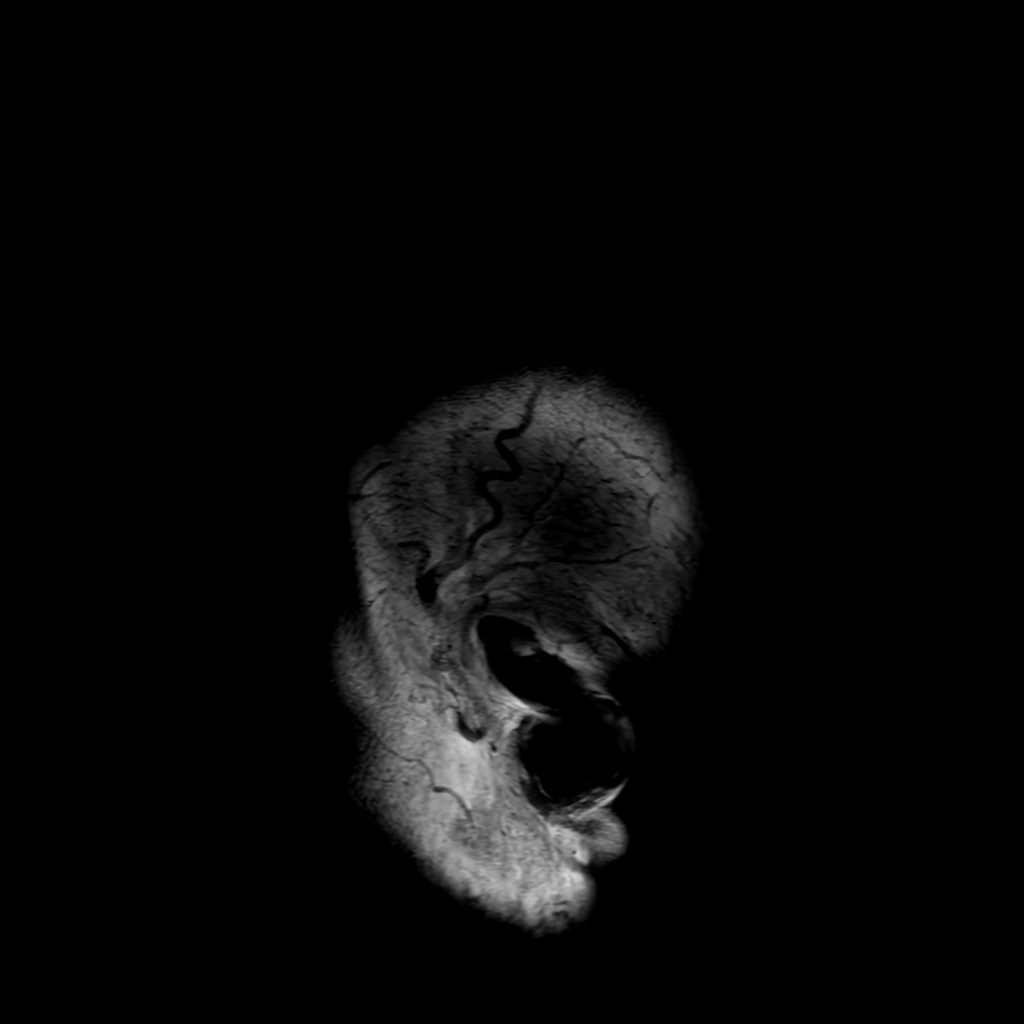
[im 13/25]
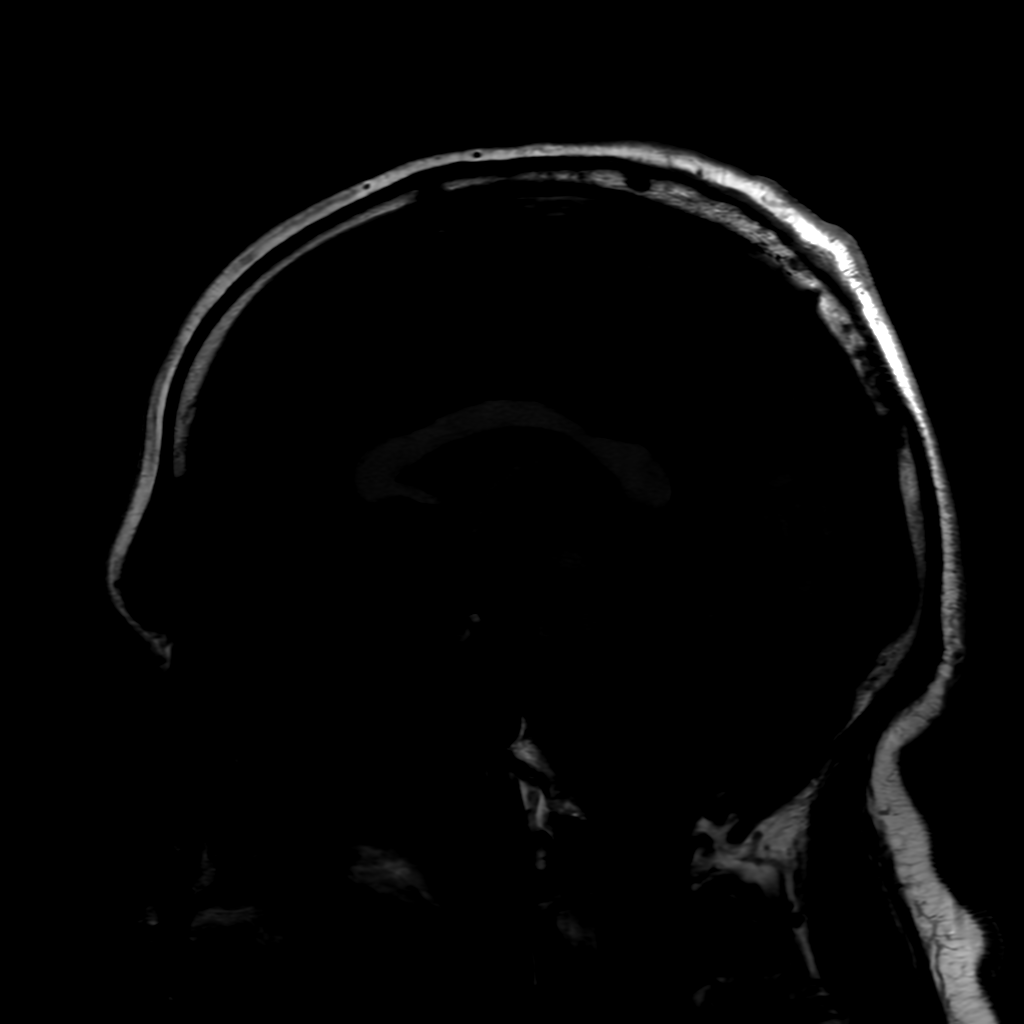
[im 25/25]
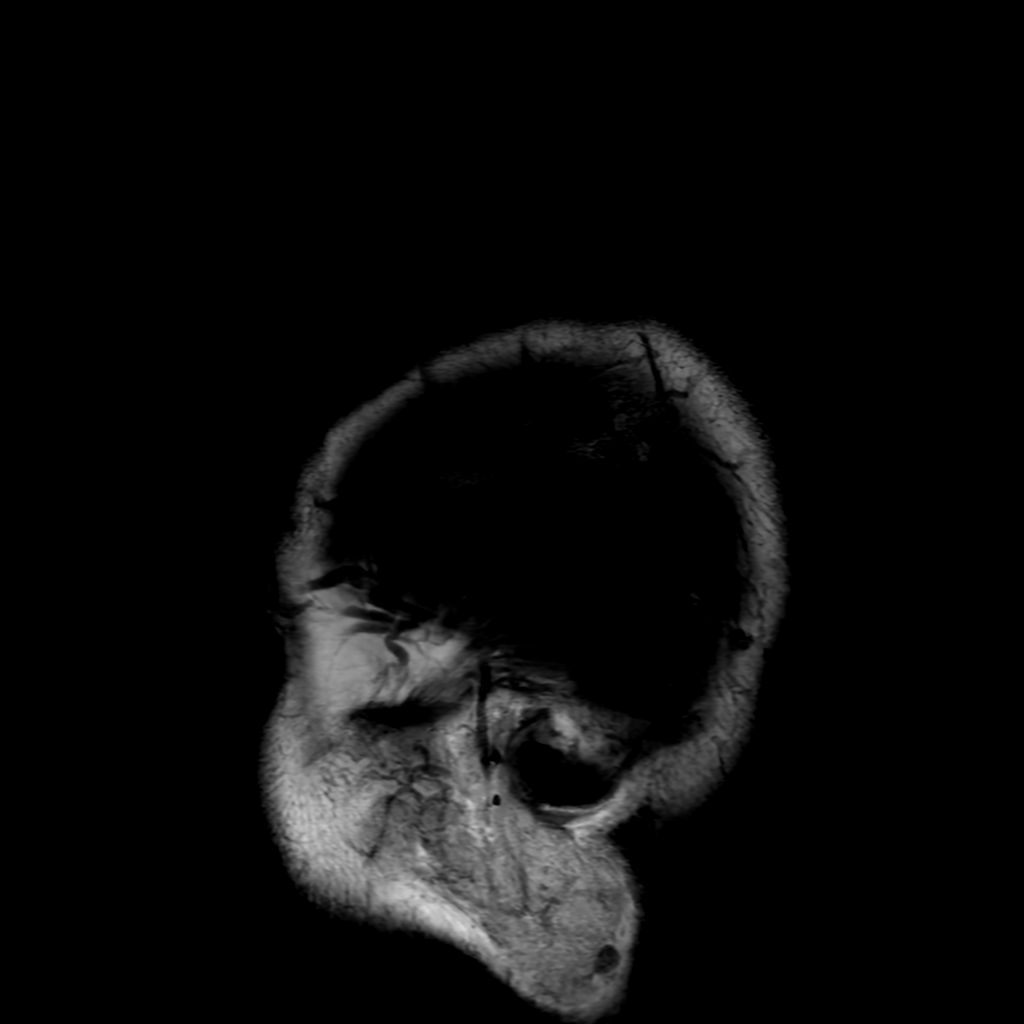

[Series 250: ADC · axial · 3.0mm · 0.94mm/px · z∈[-45,+111]mm · 6 of 54 slices shown]
[im 1/54]
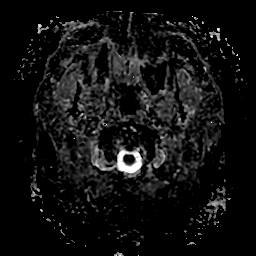
[im 11/54]
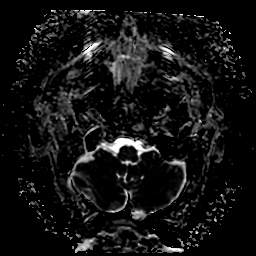
[im 22/54]
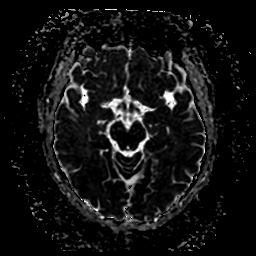
[im 32/54]
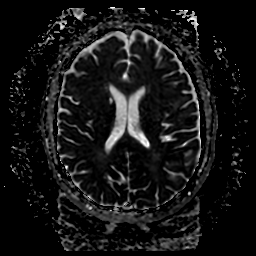
[im 43/54]
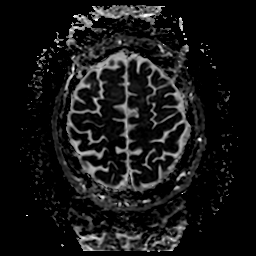
[im 54/54]
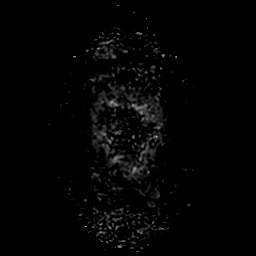

[29 of 48 positions shown; findings below may reference images not displayed]

FINDINGS: Brain: No restricted diffusion to suggest acute infarction. No
midline shift, mass effect, evidence of mass lesion,
ventriculomegaly, extra-axial collection or acute intracranial
hemorrhage. Cervicomedullary junction and pituitary are within
normal limits.

Small chronic lacunar infarcts in the right basal ganglia. But
elsewhere only mild for age nonspecific white matter T2 and FLAIR
hyperintensity. No cortical encephalomalacia or chronic cerebral
blood products identified. The other deep gray nuclei, the brainstem
and cerebellum are normal for age.

Vascular: Major intracranial vascular flow voids are preserved.

Skull and upper cervical spine: Negative for age visible cervical
spine. Normal bone marrow signal.

Sinuses/Orbits: Negative orbits. Trace paranasal sinus mucosal
thickening.

Other: Mastoids are clear. Visible internal auditory structures
appear normal. Negative visible scalp and face.
IMPRESSION: 1. No acute intracranial abnormality.
2. Small chronic lacunar infarcts of the right basal ganglia but
otherwise largely normal for age noncontrast MRI appearance of the
brain.

## 2022-11-01 ENCOUNTER — Ambulatory Visit (INDEPENDENT_AMBULATORY_CARE_PROVIDER_SITE_OTHER): Payer: Managed Care, Other (non HMO) | Admitting: Nurse Practitioner

## 2022-11-01 ENCOUNTER — Encounter: Payer: Self-pay | Admitting: Nurse Practitioner

## 2022-11-01 VITALS — BP 130/76 | HR 68 | Temp 98.3°F | Resp 16 | Ht 74.0 in | Wt 216.5 lb

## 2022-11-01 DIAGNOSIS — Z8673 Personal history of transient ischemic attack (TIA), and cerebral infarction without residual deficits: Secondary | ICD-10-CM | POA: Diagnosis not present

## 2022-11-01 DIAGNOSIS — N138 Other obstructive and reflux uropathy: Secondary | ICD-10-CM

## 2022-11-01 DIAGNOSIS — R030 Elevated blood-pressure reading, without diagnosis of hypertension: Secondary | ICD-10-CM

## 2022-11-01 DIAGNOSIS — G500 Trigeminal neuralgia: Secondary | ICD-10-CM

## 2022-11-01 DIAGNOSIS — Z7689 Persons encountering health services in other specified circumstances: Secondary | ICD-10-CM

## 2022-11-01 DIAGNOSIS — N401 Enlarged prostate with lower urinary tract symptoms: Secondary | ICD-10-CM

## 2022-11-01 NOTE — Assessment & Plan Note (Signed)
Patient currently maintained on carbamazepine 200 mg.  Taking medication as prescribed denies adverse drug events.  Has helped with the trigeminal neuralgia.  Continue

## 2022-11-01 NOTE — Assessment & Plan Note (Signed)
Patient has a history of a stroke he is currently on a high intensity statin and full dose baby aspirin.  No deficits

## 2022-11-01 NOTE — Progress Notes (Signed)
Established Patient Office Visit  Subjective   Patient ID: Francisco Price, male    DOB: 28-Jan-1951  Age: 72 y.o. MRN: [id number]  Chief Complaint  Patient presents with   Establish Care    HPI  Transfer of care: last seen by Hillard Danker, MD 20/22/2024 Last CPE 02/17/2022  Trigeminal neurlgai: states that it was left sided state that jaw down in a start down. Tolerates the carbamazepine   HLD: Patient currently maintained on atorvastatin 80 mg daily.  Takes medication without adverse drug event.  His report  BPH: state that he has stream issues urinary frequecny and noturia. States the medications helps very little. States that he feels that he can empty out and sometiems have   Hx of cva: history of the same. No deficits.  Patient will demonstrate an aspirin along with atorvastatin 80 mg.  Elevated BP: states that his wife is a Engineer, civil (consulting) and has been checking his blood pressure.  Been ranging from 140s systolic to 160s.  Range from lower 80s to 100s diastolic.  Patient has had a couple elevated readings at previous provider's office.  Blood pressure within normal limits today though  Tdap: unsure.  Get at local pharmacy Flu up to date Shingles: zosta vax in the past.  Did discuss Shingrix Covid: pfizer vaccines x 2 Pna vaccine: up to date   Colonoscopy: hx of colonosocpy. States around 8 years. States that he has trouble with the prep.  Patient was inquiring about Cologuard did inform patient since he had polyps in the past would recommend doing colonoscopy     Review of Systems  Constitutional:  Negative for chills and fever.  Respiratory:  Negative for shortness of breath.   Cardiovascular:  Negative for chest pain.  Gastrointestinal:  Negative for abdominal pain, blood in stool, constipation, diarrhea, nausea and vomiting.       BM daily   Genitourinary:  Negative for dysuria and hematuria.  Neurological:  Negative for headaches.  Psychiatric/Behavioral:   Negative for hallucinations and suicidal ideas.       Objective:     BP 130/76   Pulse 68   Temp 98.3 F (36.8 C)   Resp 16   Ht 6\' 2"  (1.88 m)   Wt 216 lb 8 oz (98.2 kg)   SpO2 98%   BMI 27.80 kg/m  BP Readings from Last 3 Encounters:  11/01/22 130/76  09/15/22 (!) 160/80  09/04/22 (!) 162/88   Wt Readings from Last 3 Encounters:  11/01/22 216 lb 8 oz (98.2 kg)  09/15/22 220 lb (99.8 kg)  09/04/22 217 lb 6 oz (98.6 kg)      Physical Exam Vitals and nursing note reviewed.  Constitutional:      Appearance: Normal appearance.  HENT:     Right Ear: Tympanic membrane, ear canal and external ear normal.     Left Ear: Tympanic membrane, ear canal and external ear normal.     Mouth/Throat:     Mouth: Mucous membranes are moist.     Pharynx: Oropharynx is clear.  Eyes:     Extraocular Movements: Extraocular movements intact.     Pupils: Pupils are equal, round, and reactive to light.     Comments: Glasses mainly will do contacts also   Cardiovascular:     Rate and Rhythm: Normal rate and regular rhythm.     Heart sounds: Normal heart sounds.  Pulmonary:     Effort: Pulmonary effort is normal.     Breath  sounds: Normal breath sounds.  Lymphadenopathy:     Cervical: No cervical adenopathy.  Neurological:     General: No focal deficit present.     Mental Status: He is alert.     Comments: Bilateral upper and lower extremity strength 5/5      No results found for any visits on 11/01/22.    The ASCVD Risk score (Arnett DK, et al., 2019) failed to calculate for the following reasons:   The patient has a prior MI or stroke diagnosis    Assessment & Plan:   Problem List Items Addressed This Visit       Nervous and Auditory   Trigeminal neuralgia of left side of face    Patient currently maintained on carbamazepine 200 mg.  Taking medication as prescribed denies adverse drug events.  Has helped with the trigeminal neuralgia.  Continue        Genitourinary    Benign prostatic hyperplasia with urinary obstruction    Patient currently maintained on tamsulosin 0.4 mg and tadalafil 10 mg daily.  Patient has modest relief of symptoms.  Continue medication as prescribed seems to be tolerating them well        Other   Establishing care with new doctor, encounter for - Primary    Patient was transferred here did do a brief review of EMR      History of completed stroke    Patient has a history of a stroke he is currently on a high intensity statin and full dose baby aspirin.  No deficits      Elevated blood pressure reading    Elevated blood pressures in the past and at home.  Blood pressure within normal limits today.  Patient can continue having his spouse who is a nurse spotcheck at home we will reevaluate at his physical with me in a few months if elevated consider blood pressure medications at that juncture.       Return in about 4 months (around 03/03/2023) for CPE and Labs.    Audria Nine, NP

## 2022-11-01 NOTE — Assessment & Plan Note (Signed)
Patient was transferred here did do a brief review of EMR

## 2022-11-01 NOTE — Patient Instructions (Signed)
Nice to see you today I want to see you in 4 months for your physical and labs, sooner if you need me

## 2022-11-01 NOTE — Assessment & Plan Note (Signed)
Patient currently maintained on tamsulosin 0.4 mg and tadalafil 10 mg daily.  Patient has modest relief of symptoms.  Continue medication as prescribed seems to be tolerating them well

## 2022-11-01 NOTE — Assessment & Plan Note (Signed)
Elevated blood pressures in the past and at home.  Blood pressure within normal limits today.  Patient can continue having his spouse who is a nurse spotcheck at home we will reevaluate at his physical with me in a few months if elevated consider blood pressure medications at that juncture.

## 2022-12-25 ENCOUNTER — Telehealth: Payer: Managed Care, Other (non HMO) | Admitting: Physician Assistant

## 2022-12-25 DIAGNOSIS — L237 Allergic contact dermatitis due to plants, except food: Secondary | ICD-10-CM

## 2022-12-25 MED ORDER — PREDNISONE 10 MG PO TABS: 10.0000 mg | ORAL_TABLET | Freq: Every day | ORAL | 0 refills | Status: AC

## 2022-12-25 NOTE — Progress Notes (Signed)
E-Visit for [employer]  We are sorry that you are not feeing well.  Here is how we plan to help!  Based on what you have shared with me it looks like you have had an allergic reaction to the oily resin from a group of plants.  This resin is very sticky, so it easily attaches to your skin, clothing, tools equipment, and pet's fur.    This blistering rash is often called poison ivy rash although it can come from contact with the leaves, stems and roots of poison ivy, poison oak and poison sumac.  The oily resin contains urushiol (u-ROO-she-ol) that produces a skin rash on exposed skin.  The severity of the rash depends on the amount of urushiol that gets on your skin.  A section of skin with more urushiol on it may develop a rash sooner.  The rash usually develops 12-48 hours after exposure and can last two to three weeks.  Your skin must come in direct contact with the plant's oil to be affected.  Blister fluid doesn't spread the rash.  However, if you come into contact with a piece of clothing or pet fur that has urushiol on it, the rash may spread out.  You can also transfer the oil to other parts of your body with your fingers.  Often the rash looks like a straight line because of the way the plant brushes against your skin.  Most poison ivy treatments are usually limited to self-care methods. Small areas of the rash will typically go away on its own in two to three weeks.  Since your rash is limited, I am recommending that you follow these recommendations:  Make sure that the clothes you were wearing and any towels or sheets that may have come in contact with the oil (urushiol) are washed in detergent and hot water.  Apply Benadryl or Caladryl lotion to the rash.  You may apply these as often as needed to control the itching.  Cool baths also often help with itching.  You may also apply an over-the-counter corticosteroid cream for the first few days.  Take oral antihistamines, such as diphenhydramine  (Benadryl, others), which may also help you sleep better.  Soak in a cool-water bath containing an oatmeal-based bath product (Aveeno).  Place Cool, wet compresses on the affected area for 15 to 30 minutes several times a day.  Avoid a hot shower or bath as this may increase your itching.     I have developed the following plan to treat your condition I am prescribing a two week course of steroids (37 tablets of 10 mg prednisone).  Days 1-4 take 4 tablets (40 mg) daily  Days 5-8 take 3 tablets (30 mg) daily, Days 9-11 take 2 tablets (20 mg) daily, Days 12-14 take 1 tablet (10 mg) daily.    What can you do to prevent this rash?  Avoid the plants.  Learn how to identify poison ivy, poison oak and poison sumac in all seasons.  When hiking or engaging in other activities that might expose you to these plants, try to stay on cleared pathways.  If camping, make sure you pitch your tent in an area free of these plants.  Keep pets from running through wooded areas so that urushiol doesn't accidentally stick to their fur, which you may touch.  Remove or kill the plants.  In your yard, you can get rid of poison ivy by applying an herbicide or pulling it out of the  ground, including the roots, while wearing heavy gloves.  Afterward remove the gloves and thoroughly wash them and your hands.  Don't burn poison ivy or related plants because the urushiol can be carried by smoke.  Wear protective clothing.  If needed, protect your skin by wearing socks, boots, pants, long sleeves and vinyl gloves.  Wash your skin right away.  Washing off the oil with soap and water within 30 minutes of exposure may reduce your chances of getting a poison ivy rash.  Even washing after an hour or so can help reduce the severity of the rash.  If you walk through some poison ivy and then later touch your shoes, you may get some urushiol on your hands, which may then transfer to your face or body by touching or rubbing.  If the  contaminated object isn't cleaned, the urushiol on it can still cause a skin reaction years later.   Be careful not to reuse towels after you have washed your skin.  Also carefully wash clothing in detergent and hot water to remove all traces of the oil.  Handle contaminated clothing carefully so you don't transfer the urushiol to yourself, furniture, rugs or appliances.  Remember that pets can carry the oil on their fur and paws.  If you think your pet may be contaminated with urushiol, put on some long rubber gloves and give your pet a bath.  Finally, be careful not to burn these plants as the smoke can contain traces of the oil.  Inhaling the smoke may result in difficulty breathing. If that occurred you should see a physician as soon as possible.  See your doctor right away if:  The reaction is severe or widespread You inhaled the smoke from burning poison ivy and are having difficulty breathing Your skin continues to swell The rash affects your eyes, mouth or genitals Blisters are oozing pus You develop a fever greater than 100 F (37.8 C) The rash doesn't get better within a few weeks.  If you scratch the poison ivy rash, bacteria under your fingernails may cause the skin to become infected.  See your doctor if pus starts oozing from the blisters.  Treatment generally includes antibiotics.  Poison ivy treatments are usually limited to self-care methods.  And the rash typically goes away on its own in two to three weeks.     If the rash is widespread or results in a large number of blisters, your doctor may prescribe an oral corticosteroid, such as prednisone.  If a bacterial infection has developed at the rash site, your doctor may give you a prescription for an oral antibiotic.  MAKE SURE YOU  Understand these instructions. Will watch your condition. Will get help right away if you are not doing well or get worse.   Thank you for choosing an e-visit.  Your e-visit answers were  reviewed by a board certified advanced clinical practitioner to complete your personal care plan. Depending upon the condition, your plan could have included both over the counter or prescription medications.  Please review your pharmacy choice. Make sure the pharmacy is open so you can pick up prescription now. If there is a problem, you may contact your provider through [employer] and have the prescription routed to another pharmacy.  Your safety is important to [place]. If you have drug allergies check your prescription carefully.   For the next 24 hours you can use MyChart to ask questions about today's visit, request a non-urgent call back,  or ask for a work or school excuse. You will get an email in the next two days asking about your experience. I hope that your e-visit has been valuable and will speed your recovery.    I have spent 5 minutes in review of e-visit questionnaire, review and updating patient chart, medical decision making and response to patient.   Tylene Fantasia Ward, PA-C

## 2023-02-25 ENCOUNTER — Other Ambulatory Visit: Payer: Self-pay | Admitting: Internal Medicine

## 2023-03-03 ENCOUNTER — Ambulatory Visit (INDEPENDENT_AMBULATORY_CARE_PROVIDER_SITE_OTHER): Payer: Managed Care, Other (non HMO) | Admitting: Nurse Practitioner

## 2023-03-03 ENCOUNTER — Encounter: Payer: Self-pay | Admitting: Nurse Practitioner

## 2023-03-03 VITALS — BP 128/80 | HR 70 | Temp 97.6°F | Ht 73.0 in | Wt 212.0 lb

## 2023-03-03 DIAGNOSIS — E663 Overweight: Secondary | ICD-10-CM

## 2023-03-03 DIAGNOSIS — Z125 Encounter for screening for malignant neoplasm of prostate: Secondary | ICD-10-CM | POA: Diagnosis not present

## 2023-03-03 DIAGNOSIS — Z1211 Encounter for screening for malignant neoplasm of colon: Secondary | ICD-10-CM

## 2023-03-03 DIAGNOSIS — Z1159 Encounter for screening for other viral diseases: Secondary | ICD-10-CM

## 2023-03-03 DIAGNOSIS — N138 Other obstructive and reflux uropathy: Secondary | ICD-10-CM

## 2023-03-03 DIAGNOSIS — N401 Enlarged prostate with lower urinary tract symptoms: Secondary | ICD-10-CM | POA: Diagnosis not present

## 2023-03-03 DIAGNOSIS — Z8673 Personal history of transient ischemic attack (TIA), and cerebral infarction without residual deficits: Secondary | ICD-10-CM | POA: Diagnosis not present

## 2023-03-03 DIAGNOSIS — Z0000 Encounter for general adult medical examination without abnormal findings: Secondary | ICD-10-CM

## 2023-03-03 DIAGNOSIS — G500 Trigeminal neuralgia: Secondary | ICD-10-CM

## 2023-03-03 LAB — COMPREHENSIVE METABOLIC PANEL
ALT: 19 U/L (ref 0–53)
AST: 18 U/L (ref 0–37)
Albumin: 4.3 g/dL (ref 3.5–5.2)
Alkaline Phosphatase: 57 U/L (ref 39–117)
BUN: 17 mg/dL (ref 6–23)
CO2: 28 mEq/L (ref 19–32)
Calcium: 9.4 mg/dL (ref 8.4–10.5)
Chloride: 107 mEq/L (ref 96–112)
Creatinine, Ser: 1.34 mg/dL (ref 0.40–1.50)
GFR: 52.81 mL/min — ABNORMAL LOW (ref >60.00)
Glucose, Bld: 96 mg/dL (ref 70–99)
Potassium: 4 mEq/L (ref 3.5–5.1)
Sodium: 142 mEq/L (ref 135–145)
Total Bilirubin: 0.9 mg/dL (ref 0.2–1.2)
Total Protein: 6.1 g/dL (ref 6.0–8.3)

## 2023-03-03 LAB — LIPID PANEL
Cholesterol: 98 mg/dL (ref 0–200)
HDL: 31.3 mg/dL — ABNORMAL LOW (ref >39.00)
LDL Cholesterol: 47 mg/dL (ref 0–99)
NonHDL: 66.81
Total CHOL/HDL Ratio: 3
Triglycerides: 100 mg/dL (ref 0.0–149.0)
VLDL: 20 mg/dL (ref 0.0–40.0)

## 2023-03-03 LAB — TSH: TSH: 2.54 u[IU]/mL (ref 0.35–5.50)

## 2023-03-03 LAB — CBC
HCT: 45.9 % (ref 39.0–52.0)
Hemoglobin: 15.5 g/dL (ref 13.0–17.0)
MCHC: 33.9 g/dL (ref 30.0–36.0)
MCV: 91.7 fl (ref 78.0–100.0)
Platelets: 126 10*3/uL — ABNORMAL LOW (ref 150.0–400.0)
RBC: 5 Mil/uL (ref 4.22–5.81)
RDW: 12.8 % (ref 11.5–15.5)
WBC: 4.7 10*3/uL (ref 4.0–10.5)

## 2023-03-03 LAB — PSA, MEDICARE: PSA: 1.37 ng/ml (ref 0.10–4.00)

## 2023-03-03 LAB — HEMOGLOBIN A1C: Hgb A1c MFr Bld: 5.3 % (ref 4.6–6.5)

## 2023-03-03 NOTE — Assessment & Plan Note (Signed)
Discussed age-appropriate immunizations and screening exams.  Did review patient's personal, surgical, social, family histories.  Patient is up-to-date on all age-appropriate vaccinations that he would like.  Encourage patient to get tetanus vaccine and Shingrix shingles vaccine at local pharmacy.  Ambulatory referral placed to GI for repeat colonoscopy this is likely his last 1.  PSA placed today for prostate cancer screening.  Patient was given information at discharge about preventative healthcare maintenance with anticipatory guidance.

## 2023-03-03 NOTE — Assessment & Plan Note (Signed)
No deficits per patient report or on physical exam.  Patient is on full dose aspirin along with high intensity statin

## 2023-03-03 NOTE — Progress Notes (Signed)
Established Patient Office Visit  Subjective   Patient ID: Francisco Price, male    DOB: 1951-01-16  Age: 72 y.o. MRN: [id number]  Chief Complaint  Patient presents with   Annual Exam    Pt has no questions or concerns.     HPI  for complete physical and follow up of chronic conditions.  HLD/stroke: hx of CVA with no deficit.  Patient on full dose aspirin along with high intensity statin  GERD: Patient currently with on over-the-counter esomeprazole.  BPH: Stream issues with  urinary frequency and noctuia  Trigeminal: left sided and stopped the carbamazepine on march and not having any trounle   Immunizations: -Tetanus: Completed in unsure, get at local pharmacy -Influenza: Completed this season -Shingles: Zostavax like.  Did encourage patient get Shingrix vaccine at local pharmacy -Pneumonia: Completed Prevnar 13 and pneumococcal 23 will update with Prevnar 20 in 2 years  Diet: Fair diet. State that he is eating 3 meals a day. First two is lighter last one is larger and some snacks. Drinking water with crystal light  Exercise: No regular exercise. States that he will bowl 3 days a week   Eye exam: Completes annually. Wears glasses every other year  Dental exam: Completes semi-annually    Colonoscopy: Completed in  8 years a go. Amb referral to [facility]  Lung Cancer Screening: NA  PSA: Due  Sleep: states that he is going to bed around 1030 and get up 730 and feels rested. Does snore        Review of Systems  Constitutional:  Negative for chills and fever.  Respiratory:  Negative for shortness of breath.   Cardiovascular:  Negative for chest pain and leg swelling.  Gastrointestinal:  Negative for abdominal pain, blood in stool, constipation, diarrhea, nausea and vomiting.       BM daily  Genitourinary:  Negative for dysuria and hematuria.  Neurological:  Negative for tingling and headaches.  Psychiatric/Behavioral:  Negative for hallucinations and suicidal  ideas.       Objective:     BP 128/80   Pulse 70   Temp 97.6 F (36.4 C) (Temporal)   Ht 6\' 1"  (1.854 m)   Wt 212 lb (96.2 kg)   SpO2 97%   BMI 27.97 kg/m  BP Readings from Last 3 Encounters:  03/03/23 128/80  11/01/22 130/76  09/15/22 (!) 160/80   Wt Readings from Last 3 Encounters:  03/03/23 212 lb (96.2 kg)  11/01/22 216 lb 8 oz (98.2 kg)  09/15/22 220 lb (99.8 kg)      Physical Exam Vitals and nursing note reviewed.  Constitutional:      Appearance: Normal appearance.  HENT:     Right Ear: Tympanic membrane, ear canal and external ear normal.     Left Ear: Tympanic membrane, ear canal and external ear normal.     Mouth/Throat:     Mouth: Mucous membranes are moist.     Pharynx: Oropharynx is clear.  Eyes:     Extraocular Movements: Extraocular movements intact.     Pupils: Pupils are equal, round, and reactive to light.  Cardiovascular:     Rate and Rhythm: Normal rate and regular rhythm.     Pulses: Normal pulses.     Heart sounds: Normal heart sounds.  Pulmonary:     Effort: Pulmonary effort is normal.     Breath sounds: Normal breath sounds.  Abdominal:     General: Bowel sounds are normal. There is no distension.  Palpations: There is no mass.     Tenderness: There is no abdominal tenderness.     Hernia: No hernia is present.  Musculoskeletal:     Right lower leg: No edema.     Left lower leg: No edema.  Lymphadenopathy:     Cervical: No cervical adenopathy.  Skin:    General: Skin is warm.  Neurological:     General: No focal deficit present.     Mental Status: He is alert.     Deep Tendon Reflexes:     Reflex Scores:      Bicep reflexes are 2+ on the right side and 2+ on the left side.      Patellar reflexes are 2+ on the right side and 2+ on the left side.    Comments: Bilateral upper and lower extremity strength 5/5  Psychiatric:        Mood and Affect: Mood normal.        Behavior: Behavior normal.        Thought Content: Thought  content normal.        Judgment: Judgment normal.      No results found for any visits on 03/03/23.    The ASCVD Risk score (Arnett DK, et al., 2019) failed to calculate for the following reasons:   The patient has a prior MI or stroke diagnosis    Assessment & Plan:   Problem List Items Addressed This Visit       Nervous and Auditory   Trigeminal neuralgia of left side of face    History of the same patient was on Tegretol in the past but has been off of that for several months patient is had no recurrence of symptoms continue monitoring stable at this juncture        Genitourinary   Benign prostatic hyperplasia with urinary obstruction    Patient currently maintained on tamsulosin and tadalafil continue medication as prescribed        Other   Preventative health care - Primary    Discussed age-appropriate immunizations and screening exams.  Did review patient's personal, surgical, social, family histories.  Patient is up-to-date on all age-appropriate vaccinations that he would like.  Encourage patient to get tetanus vaccine and Shingrix shingles vaccine at local pharmacy.  Ambulatory referral placed to GI for repeat colonoscopy this is likely his last 1.  PSA placed today for prostate cancer screening.  Patient was given information at discharge about preventative healthcare maintenance with anticipatory guidance.      Relevant Orders   CBC   Comprehensive metabolic panel   TSH   History of completed stroke    No deficits per patient report or on physical exam.  Patient is on full dose aspirin along with high intensity statin      Relevant Orders   Hemoglobin A1c   Lipid panel   Overweight    Continue working on healthy lifestyle modifications pending lipid panel, TSH, A1c.      Relevant Orders   Hemoglobin A1c   Lipid panel   Other Visit Diagnoses     Encounter for hepatitis C screening test for low risk patient       Relevant Orders   Hepatitis C  Antibody   Screening for prostate cancer       Relevant Orders   PSA, Medicare   Screening for colon cancer       Relevant Orders   Ambulatory referral to Gastroenterology  Return in about 1 year (around 03/02/2024) for CPE and Labs.    Audria Nine, NP

## 2023-03-03 NOTE — Patient Instructions (Signed)
Nice to see you today I will be in touch with the labs once I have them Follow up with me in 1 year  Consider getting the shingles vaccine (shingrix) at your local pharmacy

## 2023-03-03 NOTE — Assessment & Plan Note (Signed)
History of the same patient was on Tegretol in the past but has been off of that for several months patient is had no recurrence of symptoms continue monitoring stable at this juncture

## 2023-03-03 NOTE — Assessment & Plan Note (Signed)
Patient currently maintained on tamsulosin and tadalafil continue medication as prescribed

## 2023-03-03 NOTE — Assessment & Plan Note (Signed)
Continue working on healthy lifestyle modifications pending lipid panel, TSH, A1c.

## 2023-03-05 ENCOUNTER — Encounter: Payer: Self-pay | Admitting: Nurse Practitioner

## 2023-03-06 MED ORDER — TAMSULOSIN HCL 0.4 MG PO CAPS: 0.4000 mg | ORAL_CAPSULE | Freq: Every day | ORAL | 3 refills | Status: DC

## 2023-03-14 ENCOUNTER — Encounter: Payer: Self-pay | Admitting: *Deleted

## 2023-03-25 ENCOUNTER — Other Ambulatory Visit: Payer: Self-pay | Admitting: Internal Medicine

## 2023-03-25 ENCOUNTER — Encounter: Payer: Self-pay | Admitting: Nurse Practitioner

## 2023-03-28 MED ORDER — ALPRAZOLAM 0.5 MG PO TABS: 0.5000 mg | ORAL_TABLET | Freq: Two times a day (BID) | ORAL | 0 refills | Status: DC | PRN

## 2023-03-28 NOTE — Telephone Encounter (Signed)
Was given by Dr. Okey Dupre in the past ok to refill?

## 2023-03-29 MED ORDER — TADALAFIL 10 MG PO TABS: ORAL_TABLET | ORAL | 3 refills | Status: DC

## 2023-03-29 NOTE — Addendum Note (Signed)
Addended by: Eden Emms on: 03/29/2023 01:29 PM   Modules accepted: Orders

## 2023-04-28 ENCOUNTER — Other Ambulatory Visit: Payer: Self-pay | Admitting: Nurse Practitioner

## 2023-04-28 DIAGNOSIS — Z1211 Encounter for screening for malignant neoplasm of colon: Secondary | ICD-10-CM

## 2023-04-28 DIAGNOSIS — Z1212 Encounter for screening for malignant neoplasm of rectum: Secondary | ICD-10-CM

## 2023-05-07 ENCOUNTER — Encounter: Payer: Self-pay | Admitting: Nurse Practitioner

## 2023-05-08 MED ORDER — CARBAMAZEPINE 200 MG PO TABS: 200.0000 mg | ORAL_TABLET | Freq: Two times a day (BID) | ORAL | 0 refills | Status: DC

## 2023-05-08 NOTE — Telephone Encounter (Signed)
Patient's wife called to follow up on this request, states she is having a flare up of trigeminal neuralgia. Advised that since this was not on current meds list and Susy Frizzle had never prescribed this from what I can see, he may want pt to come in for OV. Told wife we would advise with Matt's response.

## 2023-05-18 ENCOUNTER — Encounter: Payer: Self-pay | Admitting: Nurse Practitioner

## 2023-05-18 MED ORDER — TADALAFIL 20 MG PO TABS: 10.0000 mg | ORAL_TABLET | ORAL | 11 refills | Status: DC | PRN

## 2023-05-30 LAB — COLOGUARD: COLOGUARD: NEGATIVE

## 2023-06-01 ENCOUNTER — Ambulatory Visit: Payer: Managed Care, Other (non HMO) | Admitting: Nurse Practitioner

## 2023-06-01 VITALS — BP 130/80 | HR 73 | Temp 97.6°F | Ht 73.0 in | Wt 212.8 lb

## 2023-06-01 DIAGNOSIS — G500 Trigeminal neuralgia: Secondary | ICD-10-CM | POA: Diagnosis not present

## 2023-06-01 DIAGNOSIS — N529 Male erectile dysfunction, unspecified: Secondary | ICD-10-CM

## 2023-06-01 LAB — COMPREHENSIVE METABOLIC PANEL
ALT: 13 U/L (ref 0–53)
AST: 15 U/L (ref 0–37)
Albumin: 4.3 g/dL (ref 3.5–5.2)
Alkaline Phosphatase: 67 U/L (ref 39–117)
BUN: 19 mg/dL (ref 6–23)
CO2: 30 meq/L (ref 19–32)
Calcium: 9.3 mg/dL (ref 8.4–10.5)
Chloride: 105 meq/L (ref 96–112)
Creatinine, Ser: 1.42 mg/dL (ref 0.40–1.50)
GFR: 49.18 mL/min — ABNORMAL LOW (ref >60.00)
Glucose, Bld: 124 mg/dL — ABNORMAL HIGH (ref 70–99)
Potassium: 4.3 meq/L (ref 3.5–5.1)
Sodium: 141 meq/L (ref 135–145)
Total Bilirubin: 0.7 mg/dL (ref 0.2–1.2)
Total Protein: 6.2 g/dL (ref 6.0–8.3)

## 2023-06-01 LAB — CBC
HCT: 46.4 % (ref 39.0–52.0)
Hemoglobin: 15.7 g/dL (ref 13.0–17.0)
MCHC: 33.7 g/dL (ref 30.0–36.0)
MCV: 92.7 fL (ref 78.0–100.0)
Platelets: 136 10*3/uL — ABNORMAL LOW (ref 150.0–400.0)
RBC: 5.01 Mil/uL (ref 4.22–5.81)
RDW: 13 % (ref 11.5–15.5)
WBC: 4.4 10*3/uL (ref 4.0–10.5)

## 2023-06-01 LAB — SEDIMENTATION RATE: Sed Rate: 1 mm/h (ref 0–20)

## 2023-06-01 MED ORDER — TADALAFIL 20 MG PO TABS: 10.0000 mg | ORAL_TABLET | ORAL | 1 refills | Status: DC | PRN

## 2023-06-01 NOTE — Progress Notes (Signed)
Established Patient Office Visit  Subjective   Patient ID: Francisco Price, male    DOB: Nov 25, 1950  Age: 72 y.o. MRN: [id number]  Chief Complaint  Patient presents with   Follow-up    Pt states that medication (Cialis and Tegretol) are okay, states that the pain in right side of face is getting better.     HPI  Trigeminal neuralgia: Patient was on carbamazepine 200 mg twice a day prior to coming to my service.  States this was beneficial.  At last office the patient stated he quit the medication because he felt like any longer needed.  Patient did reach out to me via MyChart on 05/07/2023 stating he is having some discomfort again.  We did start back the carbamazepine 200 mg twice daily and patient here for follow-up.  States that what brought it on was wearing a headset. States that he is taking it one a night. States that the pain has remitted. States that it was on the left side to begin with and now it is on the right.   ED: states that he was on the cialis on the 10 and was ineffective. He messaged me and we went up to the 20mg . States that the 20mg  is helping.     Review of Systems  Constitutional:  Negative for chills and fever.  Respiratory:  Negative for shortness of breath.   Cardiovascular:  Negative for chest pain.  Neurological:  Positive for tingling. Negative for headaches.  Psychiatric/Behavioral:  Negative for hallucinations and suicidal ideas.       Objective:     BP 130/80   Pulse 73   Temp 97.6 F (36.4 C) (Oral)   Ht 6\' 1"  (1.854 m)   Wt 212 lb 12.8 oz (96.5 kg)   SpO2 96%   BMI 28.08 kg/m  BP Readings from Last 3 Encounters:  06/01/23 130/80  03/03/23 128/80  11/01/22 130/76   Wt Readings from Last 3 Encounters:  06/01/23 212 lb 12.8 oz (96.5 kg)  03/03/23 212 lb (96.2 kg)  11/01/22 216 lb 8 oz (98.2 kg)   SpO2 Readings from Last 3 Encounters:  06/01/23 96%  03/03/23 97%  11/01/22 98%      Physical Exam Vitals and nursing note  reviewed.  Constitutional:      Appearance: Normal appearance.  Cardiovascular:     Rate and Rhythm: Normal rate and regular rhythm.     Heart sounds: Normal heart sounds.  Pulmonary:     Effort: Pulmonary effort is normal.     Breath sounds: Normal breath sounds.  Neurological:     Mental Status: He is alert.      No results found for any visits on 06/01/23.    The ASCVD Risk score (Arnett DK, et al., 2019) failed to calculate for the following reasons:   The patient has a prior MI or stroke diagnosis    Assessment & Plan:   Problem List Items Addressed This Visit       Nervous and Auditory   Trigeminal neuralgia of left side of face - Primary    Patient started carbamazepine back and was just doing it once at night.  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  Patient will hold tadalafil 10 mg states lost its effectiveness.  He doubled up to 20 mg with effectiveness.  Will write patient a 30-day supply and can use GoodRx as insurance limits to 6 pills a month       Return in about 9 months (around 03/03/2024) for CPE and Labs.    Audria Nine, NP

## 2023-06-01 NOTE — Assessment & Plan Note (Signed)
History of same.  Patient will hold tadalafil 10 mg states lost its effectiveness.  He doubled up to 20 mg with effectiveness.  Will write patient a 30-day supply and can use GoodRx as insurance limits to 6 pills a month

## 2023-06-01 NOTE — Patient Instructions (Addendum)
Nice to see you today I will be in touch with the labs once I have them Follow up with me on 03/04/2023 or a little after for your physical

## 2023-06-01 NOTE — Assessment & Plan Note (Signed)
Patient started carbamazepine back and was just doing it once at night.  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

## 2023-06-27 ENCOUNTER — Other Ambulatory Visit: Payer: Self-pay | Admitting: Internal Medicine

## 2023-06-30 ENCOUNTER — Encounter: Payer: Self-pay | Admitting: Nurse Practitioner

## 2023-06-30 MED ORDER — ATORVASTATIN CALCIUM 80 MG PO TABS: 80.0000 mg | ORAL_TABLET | Freq: Every day | ORAL | 3 refills | Status: DC

## 2023-08-02 ENCOUNTER — Encounter: Payer: Self-pay | Admitting: Nurse Practitioner

## 2023-08-02 MED ORDER — ATORVASTATIN CALCIUM 80 MG PO TABS: 80.0000 mg | ORAL_TABLET | Freq: Every day | ORAL | 3 refills | Status: AC

## 2023-08-02 MED ORDER — CARBAMAZEPINE 200 MG PO TABS: 200.0000 mg | ORAL_TABLET | Freq: Two times a day (BID) | ORAL | 1 refills | Status: DC

## 2023-08-02 MED ORDER — TAMSULOSIN HCL 0.4 MG PO CAPS: 0.4000 mg | ORAL_CAPSULE | Freq: Every day | ORAL | 3 refills | Status: DC

## 2023-08-02 NOTE — Addendum Note (Signed)
 Addended by: Eden Emms on: 08/02/2023 04:43 PM   Modules accepted: Orders

## 2023-08-02 NOTE — Telephone Encounter (Signed)
 Updated pt's pharmacy.

## 2023-10-13 ENCOUNTER — Ambulatory Visit: Payer: Self-pay | Admitting: Nurse Practitioner

## 2023-10-16 ENCOUNTER — Ambulatory Visit (INDEPENDENT_AMBULATORY_CARE_PROVIDER_SITE_OTHER): Admitting: Internal Medicine

## 2023-10-16 VITALS — BP 136/84 | HR 80 | Temp 98.0°F | Ht 73.0 in | Wt 211.0 lb

## 2023-10-16 DIAGNOSIS — L509 Urticaria, unspecified: Secondary | ICD-10-CM

## 2023-10-16 NOTE — Assessment & Plan Note (Signed)
 The timing is not clear--but the rash looks like remnants of a widespread urticarial rash  Not much itching now Probably doesn't need prednisone by now Might benefit from the claritin twice a day--for now Okay benedryl at night briefly

## 2023-10-16 NOTE — Progress Notes (Signed)
 Subjective:    Patient ID: Francisco Price, male    DOB: 1950/10/31, 73 y.o.   MRN: [id number]  HPI Here due to rash  Rash along his flanks Started 2 weeks ago or so--and has been increasing in size Itches at times  Did have a new medication from urologist that gave him hives (finasteride) It was to shrink the prostate Stopped it about a week ago  Takes the claritin daily  Current Outpatient Medications on File Prior to Visit  Medication Sig Dispense Refill   ALPRAZolam (XANAX) 0.5 MG tablet Take 1 tablet (0.5 mg total) by mouth 2 (two) times daily as needed for anxiety. 10 tablet 0   aspirin EC 325 MG tablet Take 1 tablet (325 mg total) by mouth daily. 30 tablet 11   atorvastatin (LIPITOR) 80 MG tablet Take 1 tablet (80 mg total) by mouth daily. 90 tablet 3   carbamazepine (TEGRETOL) 200 MG tablet Take 1 tablet (200 mg total) by mouth 2 (two) times daily. 180 tablet 1   esomeprazole (NEXIUM) 20 MG capsule      loratadine (CLARITIN) 10 MG tablet Take 10 mg by mouth daily as needed for allergies.     senna (SENOKOT) 8.6 MG TABS tablet Take 1 tablet by mouth at bedtime.     tadalafil (CIALIS) 20 MG tablet Take 0.5-1 tablets (10-20 mg total) by mouth every other day as needed for erectile dysfunction. 30 tablet 1   tamsulosin (FLOMAX) 0.4 MG CAPS capsule Take 1 capsule (0.4 mg total) by mouth daily. 90 capsule 3   No current facility-administered medications on file prior to visit.    Allergies  Allergen Reactions   Niacin     REACTION: intol w/ flushing    Past Medical History:  Diagnosis Date   Benign prostatic hypertrophy    DDD (degenerative disc disease)    Hx of colonic polyps    Hypercholesteremia    Panic attack    Stroke [facility])    Umbilical hernia     Past Surgical History:  Procedure Laterality Date   KNEE ARTHROSCOPY Left    removed knee cap    Family History  Problem Relation Age of Onset   Heart disease Mother    Heart disease Father    Heart  disease Brother     Social History   Socioeconomic History   Marital status: Married    Spouse name: tina   Number of children: 3   Years of education: college   Highest education level: Associate degree: academic program  Occupational History   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   Left-handed.   Two cans soda per day.   Social Drivers of Corporate investment banker Strain: Low Risk  (10/16/2023)   Overall Financial Resource Strain (CARDIA)    Difficulty of Paying Living Expenses: Not hard at all  Food Insecurity: No Food Insecurity (10/16/2023)   Hunger Vital Sign    Worried About Running Out of Food in the Last Year: Never true    Ran Out of  Food in the Last Year: Never true  Transportation Needs: No Transportation Needs (10/16/2023)   PRAPARE - Administrator, Civil Service (Medical): No    Lack of Transportation (Non-Medical): No  Physical Activity: Sufficiently Active (10/16/2023)   Exercise Vital Sign    Days of Exercise per Week: 3 days    Minutes of Exercise per Session: 120 min  Stress: No Stress Concern Present (10/16/2023)   [employer] of Occupational Health - Occupational Stress Questionnaire    Feeling of Stress : Only a little  Social Connections: Socially Integrated (10/16/2023)   Social Connection and Isolation Panel [NHANES]    Frequency of Communication with Friends and Family: More than three times a week    Frequency of Social Gatherings with Friends and Family: Once a week    Attends Religious Services: More than 4 times per year    Active Member of [employer] or Organizations: Yes    Attends Museum/gallery exhibitions officer: More than 4 times per year    Marital Status: Married  Catering manager Violence: Not on file   Review of Systems No fever Wife gives him benedryl at night    Objective:   Physical Exam HENT:     Mouth/Throat:     Comments: No oral lesions Skin:    Comments: Residual urticarial rash mostly on anterior thighs Widespread papulosquamous rash on trunk and some on legs No inflammation            Assessment & Plan:

## 2023-10-17 ENCOUNTER — Other Ambulatory Visit: Payer: Self-pay | Admitting: Nurse Practitioner

## 2023-11-06 ENCOUNTER — Encounter: Payer: Self-pay | Admitting: Family Medicine

## 2023-11-06 ENCOUNTER — Ambulatory Visit: Admitting: Family Medicine

## 2023-11-06 VITALS — BP 134/70 | HR 86 | Temp 99.3°F | Resp 20 | Ht 73.0 in | Wt 211.2 lb

## 2023-11-06 DIAGNOSIS — D72810 Lymphocytopenia: Secondary | ICD-10-CM

## 2023-11-06 DIAGNOSIS — R6883 Chills (without fever): Secondary | ICD-10-CM

## 2023-11-06 DIAGNOSIS — R197 Diarrhea, unspecified: Secondary | ICD-10-CM | POA: Diagnosis not present

## 2023-11-06 DIAGNOSIS — R0982 Postnasal drip: Secondary | ICD-10-CM | POA: Diagnosis not present

## 2023-11-06 LAB — COMPREHENSIVE METABOLIC PANEL WITH GFR
ALT: 33 U/L (ref 0–53)
AST: 31 U/L (ref 0–37)
Albumin: 4.5 g/dL (ref 3.5–5.2)
Alkaline Phosphatase: 73 U/L (ref 39–117)
BUN: 17 mg/dL (ref 6–23)
CO2: 23 meq/L (ref 19–32)
Calcium: 8.6 mg/dL (ref 8.4–10.5)
Chloride: 102 meq/L (ref 96–112)
Creatinine, Ser: 1.41 mg/dL (ref 0.40–1.50)
GFR: 49.45 mL/min — ABNORMAL LOW (ref >60.00)
Glucose, Bld: 108 mg/dL — ABNORMAL HIGH (ref 70–99)
Potassium: 3.9 meq/L (ref 3.5–5.1)
Sodium: 137 meq/L (ref 135–145)
Total Bilirubin: 0.7 mg/dL (ref 0.2–1.2)
Total Protein: 6.3 g/dL (ref 6.0–8.3)

## 2023-11-06 LAB — CBC WITH DIFFERENTIAL/PLATELET
Basophils Absolute: 0 10*3/uL (ref 0.0–0.1)
Basophils Relative: 0.3 % (ref 0.0–3.0)
Eosinophils Absolute: 0 10*3/uL (ref 0.0–0.7)
Eosinophils Relative: 0 % (ref 0.0–5.0)
HCT: 43.9 % (ref 39.0–52.0)
Hemoglobin: 15.2 g/dL (ref 13.0–17.0)
Lymphocytes Relative: 4.1 % — ABNORMAL LOW (ref 12.0–46.0)
Lymphs Abs: 0.4 10*3/uL — ABNORMAL LOW (ref 0.7–4.0)
MCHC: 34.5 g/dL (ref 30.0–36.0)
MCV: 90.8 fl (ref 78.0–100.0)
Monocytes Absolute: 1.1 10*3/uL — ABNORMAL HIGH (ref 0.1–1.0)
Monocytes Relative: 11 % (ref 3.0–12.0)
Neutro Abs: 8.2 10*3/uL — ABNORMAL HIGH (ref 1.4–7.7)
Neutrophils Relative %: 84.6 % — ABNORMAL HIGH (ref 43.0–77.0)
Platelets: 127 10*3/uL — ABNORMAL LOW (ref 150.0–400.0)
RBC: 4.83 Mil/uL (ref 4.22–5.81)
RDW: 12.6 % (ref 11.5–15.5)
WBC: 9.7 10*3/uL (ref 4.0–10.5)

## 2023-11-06 LAB — POC COVID19 BINAXNOW: SARS Coronavirus 2 Ag: NEGATIVE

## 2023-11-06 LAB — POC INFLUENZA A&B (BINAX/QUICKVUE)
Influenza A, POC: NEGATIVE
Influenza B, POC: NEGATIVE

## 2023-11-06 MED ORDER — LEVOCETIRIZINE DIHYDROCHLORIDE 5 MG PO TABS: 5.0000 mg | ORAL_TABLET | Freq: Every evening | ORAL | 0 refills | Status: DC

## 2023-11-06 MED ORDER — AZELASTINE HCL 0.1 % NA SOLN
2.0000 | Freq: Two times a day (BID) | NASAL | 0 refills | Status: DC
Start: 2023-11-06 — End: 2023-12-21

## 2023-11-06 MED ORDER — FLUTICASONE PROPIONATE 50 MCG/ACT NA SUSP: 2.0000 | Freq: Two times a day (BID) | NASAL | 0 refills | Status: DC

## 2023-11-06 NOTE — Patient Instructions (Addendum)
 It was a pleasure meeting you today. Thank you for allowing me to take part in your health care.  Our goals for today as we discussed include:  COVID negative Flu A &B negative  Collect stool sample and bring to [facility] lab.  We will get some labs today.  If they are abnormal or we need to do something about them, I will call you.  If they are normal, I will send you a message on MyChart (if it is active) or a letter in the mail.  If you don't hear from us  in 2 weeks, please call the office at the number below.   Start Flonase 2 sprays two times a day Start Astelin 2 sprays two times a day Xyzal 5 mg at night   Continue to hydrate with water.  Avoid Gatorade and high sugar drinks  If no improvement follow up with PCP  You are not currently taking Plavix  The Carbmazepine is for your Trigeminal Neuralgia   This is a list of the screening recommended for you and due dates:  Health Maintenance  Topic Date Due   DTaP/Tdap/Td vaccine (1 - Tdap) Never done   Zoster (Shingles) Vaccine (1 of 2) 05/12/2000   COVID-19 Vaccine (5 - 2024-25 season) 07/06/2024*   Flu Shot  02/23/2024   Cologuard (Stool DNA test)  05/21/2026   Pneumonia Vaccine  Completed   Hepatitis C Screening  Completed   HPV Vaccine  Aged Out   Meningitis B Vaccine  Aged Out  *Topic was postponed. The date shown is not the original due date.     If you have any questions or concerns, please do not hesitate to call the office at [phone].  I look forward to our next visit and until then take care and stay safe.  Regards,   Valli Gaw, MD   [facility]

## 2023-11-06 NOTE — Progress Notes (Addendum)
 "  SUBJECTIVE:   Chief Complaint  Patient presents with   Diarrhea    Since last night   Chills   HPI Presents for acute visit  Discussed the use of AI scribe software for clinical note transcription with the patient, who gave verbal consent to proceed.  History of Present Illness Francisco Price is a 73 year old male who presents with symptoms suggestive of a viral infection after returning from a cruise.  He began experiencing symptoms on Friday night while on a cruise, which he disembarked on Sunday. He has a significant amount of phlegm, gagging, diarrhea, inability to sleep, and a mild fever. These symptoms are similar to what he has experienced with the flu in the past. He has not checked his temperature at home but feels feverish with shaking.  The diarrhea is constant throughout the night, requiring him to change his sheets, but it seems to taper off by the morning. No blood or mucus in the stool, which is described as 'just brown.' He is eating and drinking normally without nausea or vomiting, although he experiences gagging and a sensation of trying to throw up.  He reports phlegm, postnasal drainage, and gagging, suggestive of an upper respiratory infection. No shortness of breath or pain when breathing. He has not used inhalers recently except for a saline spray on the ship.  He has been taking Mucinex DM for his symptoms and Claritin twice daily due to a previous medication-induced hives reaction. He is on a 'big white pill' for blood clots, possibly Plavix , and also takes Nexium, Tegretol , and Flomax .  He does not smoke and has only consumed alcohol twice in his life. He started drinking water regularly after his wife, a nurse, encouraged him to do so with flavored Crystallite.    PERTINENT PMH / PSH: As above  OBJECTIVE:  BP 134/70   Pulse 86   Temp 99.3 F (37.4 C)   Resp 20   Ht 6' 1 (1.854 m)   Wt 211 lb 4 oz (95.8 kg)   SpO2 95%   BMI 27.87 kg/m     Physical Exam Vitals reviewed.  Constitutional:      General: He is not in acute distress.    Appearance: Normal appearance. He is normal weight. He is not ill-appearing, toxic-appearing or diaphoretic.  Eyes:     General:        Right eye: No discharge.        Left eye: No discharge.  Cardiovascular:     Rate and Rhythm: Normal rate and regular rhythm.     Heart sounds: Normal heart sounds.  Pulmonary:     Effort: Pulmonary effort is normal.     Breath sounds: Normal breath sounds. No wheezing or rhonchi.  Abdominal:     General: Bowel sounds are normal.     Tenderness: There is no abdominal tenderness. Negative signs include Murphy's sign and McBurney's sign.  Musculoskeletal:        General: Normal range of motion.     Cervical back: Normal range of motion.  Skin:    General: Skin is warm and dry.  Neurological:     Mental Status: He is alert and oriented to person, place, and time. Mental status is at baseline.  Psychiatric:        Mood and Affect: Mood normal.        Behavior: Behavior normal.        Thought Content: Thought content normal.  Judgment: Judgment normal.           11/06/2023   11:52 AM 10/16/2023    3:54 PM 09/15/2022   10:18 AM 02/17/2022    8:21 AM 12/15/2020    9:00 AM  Depression screen PHQ 2/9  Decreased Interest 0 0 0 0 0  Down, Depressed, Hopeless 0 0 0 0 0  PHQ - 2 Score 0 0 0 0 0  Altered sleeping 0  0 0   Tired, decreased energy 1  0 0   Change in appetite 0  0 0   Feeling bad or failure about yourself  0  0 0   Trouble concentrating 0  0 0   Moving slowly or fidgety/restless 0  0 0   Suicidal thoughts 0  0 0   PHQ-9 Score 1  0 0   Difficult doing work/chores Not difficult at all  Not difficult at all        11/06/2023   11:52 AM  GAD 7 : Generalized Anxiety Score  Nervous, Anxious, on Edge 0  Control/stop worrying 0  Worry too much - different things 0  Trouble relaxing 0  Restless 0  Easily annoyed or irritable 0   Afraid - awful might happen 0  Total GAD 7 Score 0  Anxiety Difficulty Not difficult at all    ASSESSMENT/PLAN:  Diarrhea, unspecified type Assessment & Plan: Symptoms suggest viral gastroenteritis, likely norovirus due to cruise exposure. COVID-19 and influenza tests negative. Expected resolution with supportive care. - Collect stool samples for norovirus testing. - Order blood work to assess overall health. - Advise symptomatic treatment and hydration. - Instruct to monitor for worsening symptoms such as abdominal pain, fever, or increased diarrhea.  Orders: -     Comprehensive metabolic panel with GFR -     CBC with Differential/Platelet -     GI pathogen panel by PCR, stool; Future  Post-nasal drip Assessment & Plan: Mucus accumulation likely due to postnasal drip, possibly from environmental changes. Denies seasonal allergies, uses Claritin for hives. - Prescribe Avamys nasal spray for nasal congestion. - Prescribe Astelin  nasal spray for allergy-related symptoms. - Advise on symptomatic treatment for postnasal drip.  Orders: -     Fluticasone  Propionate; Place 2 sprays into both nostrils in the morning and at bedtime.  Dispense: 16 g; Refill: 0 -     Azelastine  HCl; Place 2 sprays into both nostrils 2 (two) times daily. Use in each nostril as directed  Dispense: 30 mL; Refill: 0 -     Levocetirizine Dihydrochloride ; Take 1 tablet (5 mg total) by mouth every evening.  Dispense: 30 tablet; Refill: 0  Chills Assessment & Plan: COVID negative Flu A&B negative  Orders: -     POC COVID-19 BinaxNow -     POC Influenza A&B(BINAX/QUICKVUE)  Lymphocytopenia -     CBC with Differential/Platelet; Future -     Peripheral Blood Smear Review; Future    PDMP reviewed  Return if symptoms worsen or fail to improve, for PCP.  Glenys Ferrari, MD "

## 2023-11-14 ENCOUNTER — Encounter: Payer: Self-pay | Admitting: Family Medicine

## 2023-11-14 DIAGNOSIS — R197 Diarrhea, unspecified: Secondary | ICD-10-CM | POA: Insufficient documentation

## 2023-11-14 DIAGNOSIS — R6883 Chills (without fever): Secondary | ICD-10-CM | POA: Insufficient documentation

## 2023-11-14 DIAGNOSIS — R0982 Postnasal drip: Secondary | ICD-10-CM | POA: Insufficient documentation

## 2023-11-14 NOTE — Assessment & Plan Note (Signed)
 Mucus accumulation likely due to postnasal drip, possibly from environmental changes. Denies seasonal allergies, uses Claritin for hives. - Prescribe Avamys nasal spray for nasal congestion. - Prescribe Astelin  nasal spray for allergy-related symptoms. - Advise on symptomatic treatment for postnasal drip.

## 2023-11-14 NOTE — Assessment & Plan Note (Signed)
 Symptoms suggest viral gastroenteritis, likely norovirus due to cruise exposure. COVID-19 and influenza tests negative. Expected resolution with supportive care. - Collect stool samples for norovirus testing. - Order blood work to assess overall health. - Advise symptomatic treatment and hydration. - Instruct to monitor for worsening symptoms such as abdominal pain, fever, or increased diarrhea.

## 2023-11-14 NOTE — Assessment & Plan Note (Signed)
COVID negative  Flu A/B negative

## 2023-11-15 ENCOUNTER — Telehealth: Payer: Self-pay

## 2023-11-15 NOTE — Telephone Encounter (Signed)
-----   Message from Valli Gaw sent at 11/14/2023  5:56 PM EDT ----- Low platelets.  Have been low in past. Likely from medication.  Has he been evaluated by Hematology in the past for this  Lymphocytes are low.  This may be due to infection.  Would like to repeat to ensure returns to normal.  Please schedule lab appointment to have repeat in 1 week.    Have not received stool results.    All other blood work acceptable

## 2023-11-15 NOTE — Telephone Encounter (Signed)
 Left message to return call to our office.  Okay to relay message to pt. Please document when pt is spoke to.

## 2023-11-16 NOTE — Telephone Encounter (Signed)
 Copied from CRM [id number]. Topic: General - Other >> Nov 16, 2023  2:52 PM [name] wrote: Reason for CRM: Patient stated  that when him and family went on a cruise they came back and a couple days later they received a email stating they caught something called Legionella Bacteria and he stated that's why his lymphocytes are probably low and he is stating they are feeling okay now but the cough is pretty bad and he wants to hold off on scheduling this lab appt

## 2023-12-04 ENCOUNTER — Telehealth: Payer: Self-pay

## 2023-12-04 NOTE — Telephone Encounter (Signed)
 Copied from CRM [id number]. Topic: Clinical - Medication Question >> Dec 04, 2023 10:04 AM Elita Guitar wrote: Reason for CRM: patient called and asked to speak with the nurse to discuss receiving a prescription for Viagra . He explained that Dr. Bambi Lever prescribed it for him in the past but I did not see that in chart. Please follow-up and advise.

## 2023-12-04 NOTE — Telephone Encounter (Signed)
 Patient received both tadalafil  (Cialis ) and sildenafil  (Viagra ) in the past.  Currently he has Cialis  10 mg on his medication list would he like a refill?

## 2023-12-05 NOTE — Telephone Encounter (Signed)
 Called patient states that Cialis  is not currently working for him. Would like refill sent in for Viagra .

## 2023-12-06 MED ORDER — SILDENAFIL CITRATE 100 MG PO TABS: 50.0000 mg | ORAL_TABLET | Freq: Every day | ORAL | 2 refills | Status: DC | PRN

## 2023-12-06 NOTE — Addendum Note (Signed)
 Addended by: Dorothe Gaster on: 12/06/2023 01:56 PM   Modules accepted: Orders

## 2023-12-15 ENCOUNTER — Telehealth: Payer: Self-pay

## 2023-12-15 NOTE — Telephone Encounter (Signed)
 Copied from CRM [id number]. Topic: Clinical - Medication Question >> Dec 15, 2023 11:01 AM Francisco Price P wrote: Reason for CRM: Pt advise he would like a prescription for muscle relaxer for his back- stated he took the medication years ago, would prefer to have it sent to  [id number], [place] - [address], if there's an issue pt would like to be contacted [phone]

## 2023-12-15 NOTE — Telephone Encounter (Signed)
 Called and spoke to pt. This is something he has had issues with for most of his life due to curvature of the spine. But, Jolan Natal has never treated him for it. I advised that he would need to be seen first, but we do not have appts until late next week. I did give him information about [facility]. He also wanted to be scheduled here next week. Made him an Appt with Dr Geralyn Knee on 12-21-23.

## 2023-12-19 NOTE — Progress Notes (Unsigned)
     Francisco Bob T. Dewitte Vannice, MD, CAQ Sports Medicine [facility] at [facility] [address] [place], [address]  Phone: [phone]  FAX: [phone]  [name] - 73 y.o. male  MRN [id number]  Date of Birth: 1951/03/11  Date: 12/21/2023  PCP: Dorothe Gaster, NP  Referral: Dorothe Gaster, NP  No chief complaint on file.  Subjective:   Francisco Price is a 73 y.o. very pleasant male patient with There is no height or weight on file to calculate BMI. who presents with the following:  The patient is a new patient to me, and he presents with some ongoing back pain.    Review of Systems is noted in the HPI, as appropriate  Objective:   There were no vitals taken for this visit.  GEN: No acute distress; alert,appropriate. PULM: Breathing comfortably in no respiratory distress PSYCH: Normally interactive.   Laboratory and Imaging Data:  Assessment and Plan:   ***

## 2023-12-21 ENCOUNTER — Encounter: Payer: Self-pay | Admitting: Family Medicine

## 2023-12-21 ENCOUNTER — Ambulatory Visit: Admitting: Family Medicine

## 2023-12-21 VITALS — BP 140/80 | HR 75 | Temp 98.1°F | Ht 73.0 in | Wt 212.2 lb

## 2023-12-21 DIAGNOSIS — M545 Low back pain, unspecified: Secondary | ICD-10-CM

## 2023-12-21 MED ORDER — TIZANIDINE HCL 4 MG PO TABS: 4.0000 mg | ORAL_TABLET | Freq: Every evening | ORAL | 1 refills | Status: DC | PRN

## 2024-04-14 ENCOUNTER — Other Ambulatory Visit: Payer: Self-pay | Admitting: Nurse Practitioner

## 2024-05-15 ENCOUNTER — Other Ambulatory Visit: Payer: Self-pay | Admitting: Nurse Practitioner

## 2024-05-15 NOTE — Telephone Encounter (Signed)
 Patient is overdue for a CPE. He needs to be seen in 30 days to continue getting refills. Any slot is ok

## 2024-05-21 NOTE — Telephone Encounter (Signed)
Lvm and sent mychart to schedule

## 2024-07-09 ENCOUNTER — Ambulatory Visit: Admitting: Nurse Practitioner

## 2024-07-09 ENCOUNTER — Ambulatory Visit
Admission: RE | Admit: 2024-07-09 | Discharge: 2024-07-09 | Disposition: A | Source: Ambulatory Visit | Attending: Nurse Practitioner

## 2024-07-09 VITALS — BP 136/82 | HR 73 | Temp 97.7°F | Ht 73.0 in | Wt 215.2 lb

## 2024-07-09 DIAGNOSIS — M2480 Other specific joint derangements of unspecified joint, not elsewhere classified: Secondary | ICD-10-CM

## 2024-07-09 DIAGNOSIS — M6289 Other specified disorders of muscle: Secondary | ICD-10-CM

## 2024-07-09 MED ORDER — TIZANIDINE HCL 2 MG PO TABS: 2.0000 mg | ORAL_TABLET | Freq: Two times a day (BID) | ORAL | 0 refills | Status: AC | PRN

## 2024-07-09 NOTE — Progress Notes (Signed)
 Established Patient Office Visit  Subjective   Patient ID: Francisco Price, male    DOB: July 13, 1951  Age: 73 y.o. MRN: [id number]  Chief Complaint  Patient presents with   Torticollis    Pt complains of stiff neck and now left side shoulder soreness from neck stiffness and pain. Ongoing for a couple of months. Complains of pain being constant.     HPI  With a history of trigeminal neuralgia, BPH, ED, CVA,  Discussed the use of AI scribe software for clinical note transcription with the patient, who gave verbal consent to proceed.  History of Present Illness Francisco Price is a 73 year old male who presents with left neck and shoulder pain.  He has been experiencing tightness in his neck for the past few months, which he initially tried to alleviate with stretching. The tightness has progressed to soreness in the left neck and shoulder area, preventing him from sleeping on his left side. He describes the pain as a 'two out of ten' and notes that it occasionally radiates down his arm, stopping above the elbow.  He has been taking two Aleve at night, which provides some relief but does not allow him to sleep on his left side. His wife once gave him a muscle relaxer that significantly helped, though he does not recall the name of the medication. No history of injury to the neck or shoulder.  No numbness, tingling, weakness, chest pain, shortness of breath, or headaches. He reports stiffness in the neck, particularly when turning his head, and hears a 'gravel noise.' He is currently taking atorvastatin  and Tegretol  for trigeminal neuralgia, and he has a history of using tizanidine  for muscle relaxation.     Review of Systems  Constitutional:  Negative for chills and fever.  Respiratory:  Negative for shortness of breath.   Cardiovascular:  Negative for chest pain.  Musculoskeletal:  Positive for neck pain.  Neurological:  Negative for tingling, weakness and headaches.       Objective:     BP 136/82   Pulse 73   Temp 97.7 F (36.5 C) (Oral)   Ht 6' 1 (1.854 m)   Wt 215 lb 3.2 oz (97.6 kg)   SpO2 96%   BMI 28.39 kg/m    Physical Exam Vitals and nursing note reviewed.  Constitutional:      Appearance: Normal appearance.  Cardiovascular:     Rate and Rhythm: Normal rate and regular rhythm.     Heart sounds: Normal heart sounds.  Pulmonary:     Effort: Pulmonary effort is normal.     Breath sounds: Normal breath sounds.  Musculoskeletal:        General: No tenderness.     Right shoulder: Normal pulse.     Left shoulder: No tenderness or bony tenderness. Normal range of motion. Normal strength. Normal pulse.     Cervical back: Crepitus present. No tenderness or bony tenderness. Normal range of motion.     Comments: Bilateral upper extremity strength 5/5 Bicep tendon 2+ bilaterally  Radial pulse 2+ bilaterally   Tightness felt with lateral rotation of cervical spine   Neurological:     Mental Status: He is alert.      No results found for any visits on 07/09/24.    The ASCVD Risk score (Arnett DK, et al., 2019) failed to calculate for the following reasons:   Risk score cannot be calculated because patient has a medical history suggesting prior/existing ASCVD   * -  Cholesterol units were assumed    Assessment & Plan:   Problem List Items Addressed This Visit   None Visit Diagnoses       Crepitus of cervical spine    -  Primary   Relevant Orders   DG Cervical Spine Complete     Muscle tightness       Relevant Medications   tiZANidine  (ZANAFLEX ) 2 MG tablet      Assessment and Plan Assessment & Plan Cervical spondylosis with neck pain Chronic neck pain with stiffness and crepitus, likely due to cervical spondylosis. Pain mild, localized to left neck and shoulder, occasionally radiating down arm. No numbness, tingling, or weakness. Differential includes muscle strain. - Prescribed tizanidine  2 mg twice daily as needed for  muscle relaxation, cautioning about drowsiness. - Ordered cervical spine X-ray to assess arthritis extent and confirm diagnosis. - Advised follow-up if symptoms persist.   Return in about 4 weeks (around 08/06/2024) for CPE and Labs.    Adina Crandall, NP

## 2024-07-09 NOTE — Patient Instructions (Signed)
 Nice to see you today  I will be in touch with the xrays once I have them  Follow up with me in 1 month for your physical

## 2024-07-19 ENCOUNTER — Ambulatory Visit: Payer: Self-pay | Admitting: Nurse Practitioner

## 2024-07-30 ENCOUNTER — Other Ambulatory Visit: Payer: Self-pay | Admitting: Nurse Practitioner

## 2024-08-03 ENCOUNTER — Other Ambulatory Visit: Payer: Self-pay | Admitting: Nurse Practitioner

## 2024-08-05 NOTE — Telephone Encounter (Signed)
 Patient has tadalafil  and sildenafil  on his medication list. Can we clarify please

## 2024-08-06 NOTE — Telephone Encounter (Signed)
 Called patient asked that we call wife he is not sure what he is taking. Called wife on dpr. Patient is on Tadalafil  5mg  daily from urology and Viagra  as needed.

## 2024-08-12 ENCOUNTER — Ambulatory Visit: Admitting: Nurse Practitioner

## 2024-08-14 ENCOUNTER — Other Ambulatory Visit: Payer: Self-pay | Admitting: Nurse Practitioner

## 2024-08-15 ENCOUNTER — Encounter: Payer: Self-pay | Admitting: Nurse Practitioner

## 2024-08-15 ENCOUNTER — Ambulatory Visit: Admitting: Nurse Practitioner

## 2024-08-15 VITALS — BP 132/80 | HR 65 | Temp 97.7°F | Ht 71.5 in | Wt 212.8 lb

## 2024-08-15 DIAGNOSIS — Z6829 Body mass index (BMI) 29.0-29.9, adult: Secondary | ICD-10-CM

## 2024-08-15 DIAGNOSIS — G500 Trigeminal neuralgia: Secondary | ICD-10-CM

## 2024-08-15 DIAGNOSIS — N401 Enlarged prostate with lower urinary tract symptoms: Secondary | ICD-10-CM | POA: Diagnosis not present

## 2024-08-15 DIAGNOSIS — N529 Male erectile dysfunction, unspecified: Secondary | ICD-10-CM

## 2024-08-15 DIAGNOSIS — G479 Sleep disorder, unspecified: Secondary | ICD-10-CM | POA: Diagnosis not present

## 2024-08-15 DIAGNOSIS — Z125 Encounter for screening for malignant neoplasm of prostate: Secondary | ICD-10-CM

## 2024-08-15 DIAGNOSIS — Z8673 Personal history of transient ischemic attack (TIA), and cerebral infarction without residual deficits: Secondary | ICD-10-CM | POA: Diagnosis not present

## 2024-08-15 DIAGNOSIS — E663 Overweight: Secondary | ICD-10-CM

## 2024-08-15 DIAGNOSIS — Z0000 Encounter for general adult medical examination without abnormal findings: Secondary | ICD-10-CM

## 2024-08-15 DIAGNOSIS — Z0001 Encounter for general adult medical examination with abnormal findings: Secondary | ICD-10-CM

## 2024-08-15 DIAGNOSIS — N138 Other obstructive and reflux uropathy: Secondary | ICD-10-CM | POA: Diagnosis not present

## 2024-08-15 LAB — CBC WITH DIFFERENTIAL/PLATELET
Basophils Absolute: 0 K/uL (ref 0.0–0.1)
Basophils Relative: 0.9 % (ref 0.0–3.0)
Eosinophils Absolute: 0.2 K/uL (ref 0.0–0.7)
Eosinophils Relative: 4.8 % (ref 0.0–5.0)
HCT: 44.9 % (ref 39.0–52.0)
Hemoglobin: 15.7 g/dL (ref 13.0–17.0)
Lymphocytes Relative: 19.4 % (ref 12.0–46.0)
Lymphs Abs: 1 K/uL (ref 0.7–4.0)
MCHC: 35 g/dL (ref 30.0–36.0)
MCV: 90.6 fl (ref 78.0–100.0)
Monocytes Absolute: 0.3 K/uL (ref 0.1–1.0)
Monocytes Relative: 7.1 % (ref 3.0–12.0)
Neutro Abs: 3.3 K/uL (ref 1.4–7.7)
Neutrophils Relative %: 67.8 % (ref 43.0–77.0)
Platelets: 153 K/uL (ref 150.0–400.0)
RBC: 4.95 Mil/uL (ref 4.22–5.81)
RDW: 13.1 % (ref 11.5–15.5)
WBC: 4.9 K/uL (ref 4.0–10.5)

## 2024-08-15 LAB — COMPREHENSIVE METABOLIC PANEL WITH GFR
ALT: 15 U/L (ref 3–53)
AST: 15 U/L (ref 5–37)
Albumin: 4.5 g/dL (ref 3.5–5.2)
Alkaline Phosphatase: 56 U/L (ref 39–117)
BUN: 18 mg/dL (ref 6–23)
CO2: 28 meq/L (ref 19–32)
Calcium: 9.3 mg/dL (ref 8.4–10.5)
Chloride: 106 meq/L (ref 96–112)
Creatinine, Ser: 1.39 mg/dL (ref 0.40–1.50)
GFR: 50.03 mL/min — ABNORMAL LOW
Glucose, Bld: 94 mg/dL (ref 70–99)
Potassium: 4.1 meq/L (ref 3.5–5.1)
Sodium: 143 meq/L (ref 135–145)
Total Bilirubin: 0.6 mg/dL (ref 0.2–1.2)
Total Protein: 6.5 g/dL (ref 6.0–8.3)

## 2024-08-15 LAB — LIPID PANEL
Cholesterol: 112 mg/dL (ref 28–200)
HDL: 38.6 mg/dL — ABNORMAL LOW
LDL Cholesterol: 56 mg/dL (ref 10–99)
NonHDL: 72.98
Total CHOL/HDL Ratio: 3
Triglycerides: 85 mg/dL (ref 10.0–149.0)
VLDL: 17 mg/dL (ref 0.0–40.0)

## 2024-08-15 LAB — PSA, MEDICARE: PSA: 1.76 ng/mL (ref 0.10–4.00)

## 2024-08-15 LAB — TSH: TSH: 3.65 u[IU]/mL (ref 0.35–5.50)

## 2024-08-15 LAB — HEMOGLOBIN A1C: Hgb A1c MFr Bld: 5.3 % (ref 4.6–6.5)

## 2024-08-15 NOTE — Patient Instructions (Signed)
 Nice to see you today I will be in touch with the labs once I have them  Follow up with me in 1 year.  Get some over the counter melatonin 1-3mg  to try at bedtime

## 2024-08-15 NOTE — Assessment & Plan Note (Signed)
 Pending lipid panel.  Patient currently maintained on atorvastatin 80 mg daily

## 2024-08-15 NOTE — Assessment & Plan Note (Signed)
 History of the same.  Patient currently maintained on carbamazepine  200 mg twice daily.  Controlled with medication.  Continue

## 2024-08-15 NOTE — Assessment & Plan Note (Signed)
 History of same.  Patient has weaker stream with decreased volume and nocturia intermittently.  Maintained on Flomax  0.4 mg daily.  Continue.  Patient no longer followed by urology

## 2024-08-15 NOTE — Assessment & Plan Note (Signed)
Pending TSH and lipid panel.

## 2024-08-15 NOTE — Assessment & Plan Note (Signed)
 Discussed age-appropriate stations and screenings.  Did review patient's personal, surgical, social, family histories.  Patient is up-to-date with all age-appropriate vaccinations he would like.  Patient is up-to-date on CRC screening.  PSA today for prostate cancer screening.  Patient was given information at discharge about preventative healthcare maintenance with his employer guidance.

## 2024-08-15 NOTE — Assessment & Plan Note (Signed)
 Patient will use either tadalafil  20 mg daily or sildenafil  100 mg as needed sexual intercourse

## 2024-08-15 NOTE — Progress Notes (Signed)
 "  Established Patient Office Visit  Subjective   Patient ID: Francisco Price, male    DOB: 20-Feb-1951  Age: 74 y.o. MRN: [id number]  Chief Complaint  Patient presents with   Annual Exam    HPI  Trigeminal neuralgia: Patient currently maintained on carbamazepine  200 mg twice daily  HLD: Maintained on atorvastatin  80 mg daily  GERD: Maintained on esomeprazole 20 mg daily.  ED: Maintained on sildenafil  50 to 100 mg daily as needed also takes tadalafil  20 mg through urology.  Patient also maintained on Flomax  0.4 mg daily for BPH. Weaker stream and decreased volume. Nocturia intermittent  Anxiety: On alprazolam  0.5 mg twice daily as needed.  With very infrequent use  for complete physical and follow up of chronic conditions.  Immunizations: -Tetanus: Completed in unsure -Influenza:will get it through spouses employer  -Shingles: Completed zosta vax live.  Discussed Shingrix -Pneumonia: Completed   Diet: Fair diet. He is eating 3 meals and he will snack. He is drinking  water with mix in.  Exercise:  Bowling 3 times a week   Eye exam: Completes annually. Glasses and up to date   Dental exam: Completes semi-annually    Colonoscopy: Completed in 05/22/2023 with Cologuard that was negative.  Patient due 2027 Lung Cancer Screening: N/A  PSA: Due  Sleep: going to bed around 10 and will get up around 8. States that he will have some weird dreams   Advanced directive: thinks he has one       Review of Systems  Constitutional:  Negative for chills and fever.  Respiratory:  Negative for shortness of breath.   Cardiovascular:  Negative for chest pain and leg swelling.  Gastrointestinal:  Negative for abdominal pain, blood in stool, constipation, diarrhea, nausea and vomiting.       BM every other day He will take a stool softener   Genitourinary:  Negative for dysuria and hematuria.  Neurological:  Negative for tingling and headaches.  Psychiatric/Behavioral:  Negative  for hallucinations and suicidal ideas.       Objective:     BP 132/80   Pulse 65   Temp 97.7 F (36.5 C) (Oral)   Ht 5' 11.5 (1.816 m)   Wt 212 lb 12.8 oz (96.5 kg)   SpO2 96%   BMI 29.27 kg/m  BP Readings from Last 3 Encounters:  08/15/24 132/80  07/09/24 136/82  12/21/23 (!) 140/80   Wt Readings from Last 3 Encounters:  08/15/24 212 lb 12.8 oz (96.5 kg)  07/09/24 215 lb 3.2 oz (97.6 kg)  12/21/23 212 lb 4 oz (96.3 kg)   SpO2 Readings from Last 3 Encounters:  08/15/24 96%  07/09/24 96%  12/21/23 97%      Physical Exam Vitals and nursing note reviewed.  Constitutional:      Appearance: Normal appearance.  HENT:     Right Ear: Tympanic membrane, ear canal and external ear normal.     Left Ear: Tympanic membrane, ear canal and external ear normal.     Mouth/Throat:     Mouth: Mucous membranes are moist.     Pharynx: Oropharynx is clear.  Eyes:     Extraocular Movements: Extraocular movements intact.     Pupils: Pupils are equal, round, and reactive to light.  Cardiovascular:     Rate and Rhythm: Normal rate and regular rhythm.     Pulses: Normal pulses.     Heart sounds: Normal heart sounds.  Pulmonary:     Effort: Pulmonary  effort is normal.     Breath sounds: Normal breath sounds.  Abdominal:     General: Bowel sounds are normal. There is no distension.     Palpations: There is no mass.     Tenderness: There is no abdominal tenderness.     Hernia: No hernia is present.  Musculoskeletal:     Right lower leg: No edema.     Left lower leg: No edema.  Lymphadenopathy:     Cervical: No cervical adenopathy.  Skin:    General: Skin is warm.  Neurological:     General: No focal deficit present.     Mental Status: He is alert. Mental status is at baseline.     Deep Tendon Reflexes:     Reflex Scores:      Bicep reflexes are 2+ on the right side and 2+ on the left side.      Patellar reflexes are 2+ on the right side and 0 on the left side.    Comments:  Bilateral upper and lower extremity strength 5/5  Psychiatric:        Mood and Affect: Mood normal.        Behavior: Behavior normal.        Thought Content: Thought content normal.        Judgment: Judgment normal.      No results found for any visits on 08/15/24.    The ASCVD Risk score (Arnett DK, et al., 2019) failed to calculate for the following reasons:   Risk score cannot be calculated because patient has a medical history suggesting prior/existing ASCVD   * - Cholesterol units were assumed    Assessment & Plan:   Problem List Items Addressed This Visit       Nervous and Auditory   Trigeminal neuralgia of left side of face   History of the same.  Patient currently maintained on carbamazepine  200 mg twice daily.  Controlled with medication.  Continue        Genitourinary   Benign prostatic hyperplasia with urinary obstruction   History of same.  Patient has weaker stream with decreased volume and nocturia intermittently.  Maintained on Flomax  0.4 mg daily.  Continue.  Patient no longer followed by urology      Relevant Orders   PSA, Medicare     Other   Preventative health care - Primary   Discussed age-appropriate stations and screenings.  Did review patient's personal, surgical, social, family histories.  Patient is up-to-date with all age-appropriate vaccinations he would like.  Patient is up-to-date on CRC screening.  PSA today for prostate cancer screening.  Patient was given information at discharge about preventative healthcare maintenance with his employer guidance.      Relevant Orders   CBC with Differential/Platelet   Comprehensive metabolic panel with GFR   TSH   Erectile dysfunction   Patient will use either tadalafil  20 mg daily or sildenafil  100 mg as needed sexual intercourse      History of completed stroke   Pending lipid panel.  Patient currently maintained on atorvastatin  80 mg daily      Relevant Orders   Hemoglobin A1c   Lipid  panel   Overweight   Pending TSH and lipid panel.      Sleep disturbance   Patient states he is having some difficulty sleeping.  He asked if he could do Benadryl recommended against this.  Patient to do melatonin 1 to 3 mg nightly as needed.  Other Visit Diagnoses       Screening for prostate cancer       Relevant Orders   PSA, Medicare       Return in about 1 year (around 08/15/2025) for CPE and Labs.    Adina Crandall, NP  "

## 2024-08-15 NOTE — Assessment & Plan Note (Signed)
 Patient states he is having some difficulty sleeping.  He asked if he could do Benadryl recommended against this.  Patient to do melatonin 1 to 3 mg nightly as needed.

## 2024-08-18 ENCOUNTER — Ambulatory Visit: Payer: Self-pay | Admitting: Nurse Practitioner

## 2024-11-14 ENCOUNTER — Encounter: Admitting: Nurse Practitioner
